# Patient Record
Sex: Female | Born: 1990 | Race: Black or African American | Hispanic: No | Marital: Single | Smoking: Never smoker
Health system: Southern US, Community
[De-identification: ages and names within clinical notes are randomized; demographics above are authoritative.]

## PROBLEM LIST (undated history)

## (undated) DIAGNOSIS — Z789 Other specified health status: Secondary | ICD-10-CM

## (undated) DIAGNOSIS — B009 Herpesviral infection, unspecified: Secondary | ICD-10-CM

## (undated) HISTORY — PX: NO PAST SURGERIES: SHX2092

## (undated) HISTORY — DX: Other specified health status: Z78.9

---

## 2014-03-04 DIAGNOSIS — B009 Herpesviral infection, unspecified: Secondary | ICD-10-CM

## 2014-03-04 HISTORY — DX: Herpesviral infection, unspecified: B00.9

## 2014-06-04 NOTE — L&D Delivery Note (Cosign Needed)
Delivery Note Pt's foley came out at approx 0330 and then she progressed rapidly to delivery, and at 3:43 AM a viable female was delivered via Vaginal, Spontaneous Delivery (Presentation: Right Occiput Transverse).  APGAR: 2, 7; weight 5 lb 11.9 oz (2605 g).   Placenta status: Intact, Spontaneous.  Cord: 3 vessels  Cord pH: collected, but was not sent for resulting  Anesthesia: None  Episiotomy: None Lacerations: None Est. Blood Loss (mL): 175  Mom to postpartum.  Baby to Couplet care / Skin to Skin.   [name], Johnmark Geiger CNM 03/19/2015, 4:03 AM

## 2014-08-04 LAB — OB RESULTS CONSOLE ANTIBODY SCREEN: ANTIBODY SCREEN: NEGATIVE

## 2014-08-04 LAB — OB RESULTS CONSOLE HGB/HCT, BLOOD
HCT: 33 %
Hemoglobin: 11.6 g/dL

## 2014-08-04 LAB — OB RESULTS CONSOLE RPR: RPR: NONREACTIVE

## 2014-08-04 LAB — OB RESULTS CONSOLE HEPATITIS B SURFACE ANTIGEN: Hepatitis B Surface Ag: NEGATIVE

## 2014-08-04 LAB — OB RESULTS CONSOLE HIV ANTIBODY (ROUTINE TESTING): HIV: NONREACTIVE

## 2014-08-04 LAB — OB RESULTS CONSOLE PLATELET COUNT: PLATELETS: 249 10*3/uL

## 2014-08-04 LAB — OB RESULTS CONSOLE ABO/RH: RH Type: POSITIVE

## 2014-08-04 LAB — OB RESULTS CONSOLE RUBELLA ANTIBODY, IGM: RUBELLA: IMMUNE

## 2014-09-16 LAB — OB RESULTS CONSOLE GC/CHLAMYDIA
Chlamydia: NEGATIVE
GC PROBE AMP, GENITAL: NEGATIVE

## 2014-12-25 LAB — GLUCOSE TOLERANCE, 1 HOUR (50G) W/O FASTING: Glucose 1 Hr Prenatal, POC: 93 mg/dL

## 2015-01-07 ENCOUNTER — Encounter: Payer: Self-pay | Admitting: *Deleted

## 2015-01-19 ENCOUNTER — Encounter: Payer: Self-pay | Admitting: Advanced Practice Midwife

## 2015-01-26 ENCOUNTER — Encounter: Payer: Self-pay | Admitting: Physician Assistant

## 2015-02-01 ENCOUNTER — Encounter: Payer: Self-pay | Admitting: *Deleted

## 2015-02-01 ENCOUNTER — Encounter: Payer: Self-pay | Admitting: Advanced Practice Midwife

## 2015-02-01 ENCOUNTER — Ambulatory Visit (INDEPENDENT_AMBULATORY_CARE_PROVIDER_SITE_OTHER): Payer: Medicaid Other | Admitting: Advanced Practice Midwife

## 2015-02-01 VITALS — BP 110/67 | HR 82 | Temp 98.2°F | Ht 65.5 in | Wt 129.0 lb

## 2015-02-01 DIAGNOSIS — Z3493 Encounter for supervision of normal pregnancy, unspecified, third trimester: Secondary | ICD-10-CM | POA: Diagnosis not present

## 2015-02-01 DIAGNOSIS — Z23 Encounter for immunization: Secondary | ICD-10-CM

## 2015-02-01 DIAGNOSIS — Z3483 Encounter for supervision of other normal pregnancy, third trimester: Secondary | ICD-10-CM

## 2015-02-01 DIAGNOSIS — Z3480 Encounter for supervision of other normal pregnancy, unspecified trimester: Secondary | ICD-10-CM | POA: Insufficient documentation

## 2015-02-01 DIAGNOSIS — B009 Herpesviral infection, unspecified: Secondary | ICD-10-CM | POA: Insufficient documentation

## 2015-02-01 LAB — POCT URINALYSIS DIP (DEVICE)
Bilirubin Urine: NEGATIVE
Glucose, UA: NEGATIVE mg/dL
Hgb urine dipstick: NEGATIVE
Ketones, ur: NEGATIVE mg/dL
Nitrite: NEGATIVE
PH: 7 (ref 5.0–8.0)
PROTEIN: NEGATIVE mg/dL
SPECIFIC GRAVITY, URINE: 1.015 (ref 1.005–1.030)
Urobilinogen, UA: 0.2 mg/dL (ref 0.0–1.0)

## 2015-02-01 LAB — CBC
HCT: 31 % — ABNORMAL LOW (ref 36.0–46.0)
Hemoglobin: 10.9 g/dL — ABNORMAL LOW (ref 12.0–15.0)
MCH: 34.5 pg — AB (ref 26.0–34.0)
MCHC: 35.2 g/dL (ref 30.0–36.0)
MCV: 98.1 fL (ref 78.0–100.0)
MPV: 9.2 fL (ref 8.6–12.4)
Platelets: 183 10*3/uL (ref 150–400)
RBC: 3.16 MIL/uL — ABNORMAL LOW (ref 3.87–5.11)
RDW: 13 % (ref 11.5–15.5)
WBC: 5.1 10*3/uL (ref 4.0–10.5)

## 2015-02-01 MED ORDER — TETANUS-DIPHTH-ACELL PERTUSSIS 5-2.5-18.5 LF-MCG/0.5 IM SUSP
0.5000 mL | Freq: Once | INTRAMUSCULAR | Status: AC
  Administered 2015-02-01: 0.5 mL via INTRAMUSCULAR

## 2015-02-01 NOTE — Patient Instructions (Signed)
Third Trimester of Pregnancy The third trimester is from week 29 through week 42, months 7 through 9. The third trimester is a time when the fetus is growing rapidly. At the end of the ninth month, the fetus is about 20 inches in length and weighs 6-10 pounds.  BODY CHANGES Your body goes through many changes during pregnancy. The changes vary from woman to woman.   Your weight will continue to increase. You can expect to gain 25-35 pounds (11-16 kg) by the end of the pregnancy.  You may begin to get stretch marks on your hips, abdomen, and breasts.  You may urinate more often because the fetus is moving lower into your pelvis and pressing on your bladder.  You may develop or continue to have heartburn as a result of your pregnancy.  You may develop constipation because certain hormones are causing the muscles that push waste through your intestines to slow down.  You may develop hemorrhoids or swollen, bulging veins (varicose veins).  You may have pelvic pain because of the weight gain and pregnancy hormones relaxing your joints between the bones in your pelvis. Backaches may result from overexertion of the muscles supporting your posture.  You may have changes in your hair. These can include thickening of your hair, rapid growth, and changes in texture. Some women also have hair loss during or after pregnancy, or hair that feels dry or thin. Your hair will most likely return to normal after your baby is born.  Your breasts will continue to grow and be tender. A yellow discharge may leak from your breasts called colostrum.  Your belly button may stick out.  You may feel short of breath because of your expanding uterus.  You may notice the fetus "dropping," or moving lower in your abdomen.  You may have a bloody mucus discharge. This usually occurs a few days to a week before labor begins.  Your cervix becomes thin and soft (effaced) near your due date. WHAT TO EXPECT AT YOUR PRENATAL  EXAMS  You will have prenatal exams every 2 weeks until week 36. Then, you will have weekly prenatal exams. During a routine prenatal visit:  You will be weighed to make sure you and the fetus are growing normally.  Your blood pressure is taken.  Your abdomen will be measured to track your baby's growth.  The fetal heartbeat will be listened to.  Any test results from the previous visit will be discussed.  You may have a cervical check near your due date to see if you have effaced. At around 36 weeks, your caregiver will check your cervix. At the same time, your caregiver will also perform a test on the secretions of the vaginal tissue. This test is to determine if a type of bacteria, Group B streptococcus, is present. Your caregiver will explain this further. Your caregiver may ask you:  What your birth plan is.  How you are feeling.  If you are feeling the baby move.  If you have had any abnormal symptoms, such as leaking fluid, bleeding, severe headaches, or abdominal cramping.  If you have any questions. Other tests or screenings that may be performed during your third trimester include:  Blood tests that check for low iron levels (anemia).  Fetal testing to check the health, activity level, and growth of the fetus. Testing is done if you have certain medical conditions or if there are problems during the pregnancy. FALSE LABOR You may feel small, irregular contractions that   eventually go away. These are called Braxton Hicks contractions, or false labor. Contractions may last for hours, days, or even weeks before true labor sets in. If contractions come at regular intervals, intensify, or become painful, it is best to be seen by your caregiver.  SIGNS OF LABOR   Menstrual-like cramps.  Contractions that are 5 minutes apart or less.  Contractions that start on the top of the uterus and spread down to the lower abdomen and back.  A sense of increased pelvic pressure or back  pain.  A watery or bloody mucus discharge that comes from the vagina. If you have any of these signs before the 37th week of pregnancy, call your caregiver right away. You need to go to the hospital to get checked immediately. HOME CARE INSTRUCTIONS   Avoid all smoking, herbs, alcohol, and unprescribed drugs. These chemicals affect the formation and growth of the baby.  Follow your caregiver's instructions regarding medicine use. There are medicines that are either safe or unsafe to take during pregnancy.  Exercise only as directed by your caregiver. Experiencing uterine cramps is a good sign to stop exercising.  Continue to eat regular, healthy meals.  Wear a good support bra for breast tenderness.  Do not use hot tubs, steam rooms, or saunas.  Wear your seat belt at all times when driving.  Avoid raw meat, uncooked cheese, cat litter boxes, and soil used by cats. These carry germs that can cause birth defects in the baby.  Take your prenatal vitamins.  Try taking a stool softener (if your caregiver approves) if you develop constipation. Eat more high-fiber foods, such as fresh vegetables or fruit and whole grains. Drink plenty of fluids to keep your urine clear or pale yellow.  Take warm sitz baths to soothe any pain or discomfort caused by hemorrhoids. Use hemorrhoid cream if your caregiver approves.  If you develop varicose veins, wear support hose. Elevate your feet for 15 minutes, 3-4 times a day. Limit salt in your diet.  Avoid heavy lifting, wear low heal shoes, and practice good posture.  Rest a lot with your legs elevated if you have leg cramps or low back pain.  Visit your dentist if you have not gone during your pregnancy. Use a soft toothbrush to brush your teeth and be gentle when you floss.  A sexual relationship may be continued unless your caregiver directs you otherwise.  Do not travel far distances unless it is absolutely necessary and only with the approval  of your caregiver.  Take prenatal classes to understand, practice, and ask questions about the labor and delivery.  Make a trial run to the hospital.  Pack your hospital bag.  Prepare the baby's nursery.  Continue to go to all your prenatal visits as directed by your caregiver. SEEK MEDICAL CARE IF:  You are unsure if you are in labor or if your water has broken.  You have dizziness.  You have mild pelvic cramps, pelvic pressure, or nagging pain in your abdominal area.  You have persistent nausea, vomiting, or diarrhea.  You have a bad smelling vaginal discharge.  You have pain with urination. SEEK IMMEDIATE MEDICAL CARE IF:   You have a fever.  You are leaking fluid from your vagina.  You have spotting or bleeding from your vagina.  You have severe abdominal cramping or pain.  You have rapid weight loss or gain.  You have shortness of breath with chest pain.  You notice sudden or extreme swelling   of your face, hands, ankles, feet, or legs.  You have not felt your baby move in over an hour.  You have severe headaches that do not go away with medicine.  You have vision changes. Document Released: 05/15/2001 Document Revised: 05/26/2013 Document Reviewed: 07/22/2012 ExitCare Patient Information 2015 [employer]. This information is not intended to replace advice given to you by your health care provider. Make sure you discuss any questions you have with your health care provider.  

## 2015-02-01 NOTE — Progress Notes (Signed)
Initial OB visit, transferred from [place]. TDap vaccine to be given, declined flu. Breastfeeding tip of the week reviewed.

## 2015-02-01 NOTE — Progress Notes (Signed)
   Subjective:    Kim Flowers is a G2P1001 [redacted]w[redacted]d being seen today for her first obstetrical visit.  She is transferring care from [facility].  Her obstetrical history is significant for NSVD x 1 at term. Patient does intend to breast feed. Pregnancy history fully reviewed.  Patient reports no complaints.  Filed Vitals:   02/01/15 0920 02/01/15 0925  BP: 110/67   Pulse: 82   Temp: 98.2 F (36.8 C)   Height:  5' 5.5" (1.664 m)  Weight: 129 lb (58.514 kg)     HISTORY: OB History  Gravida Para Term Preterm AB SAB TAB Ectopic Multiple Living  # Outcome Date GA Lbr Len/2nd Weight Sex Delivery Anes PTL Lv  2 Current           1 Term 10/22/11 [redacted]w[redacted]d  7 lb 7 oz (3.374 kg) F Vag-Spont   Y     History reviewed. No pertinent past medical history. History reviewed. No pertinent past surgical history. Family History  Problem Relation Age of Onset  . Cancer Mother   . Heart disease Father   . Diabetes Paternal Grandfather      Exam    Uterus:     Pelvic Exam: Deferred--done at previous practice, record reviewed  System: Breast:  normal appearance, no masses or tenderness   Skin: normal coloration and turgor, no rashes    Neurologic: gait normal; reflexes normal and symmetric   Extremities: normal strength, tone, and muscle mass, ROM of all joints is normal   HEENT sclera clear, anicteric   Mouth/Teeth mucous membranes moist, pharynx normal without lesions and dental hygiene good   Neck supple and no masses   Cardiovascular:    Respiratory:  appears well, vitals normal, no respiratory distress, acyanotic, normal RR, ear and throat exam is normal, neck free of mass or lymphadenopathy   Abdomen: soft, non-tender; bowel sounds normal; no masses,  no organomegaly   Urinary: not evaluated      Assessment:    Pregnancy: G2P1001 Patient Active Problem List   Diagnosis Date Noted  . Herpes 02/01/2015  . Supervision of normal subsequent pregnancy 02/01/2015         Plan:     28 week labs drawn. Prenatal vitamins. Discussed HSV prophylaxis at 36 weeks. Pt reports she has medication at home, will bring at next visit to confirm correct dose. Problem list reviewed and updated. Genetic Screening discussed : results reviewed.  Ultrasound discussed; fetal survey: results reviewed.  Follow up in 2 weeks.   LEFTWICH-KIRBY, LISA 02/01/2015

## 2015-02-02 LAB — RPR

## 2015-02-02 LAB — HIV ANTIBODY (ROUTINE TESTING W REFLEX): HIV: NONREACTIVE

## 2015-02-14 ENCOUNTER — Encounter: Payer: Self-pay | Admitting: *Deleted

## 2015-02-15 ENCOUNTER — Encounter: Payer: Self-pay | Admitting: Advanced Practice Midwife

## 2015-02-15 ENCOUNTER — Ambulatory Visit (INDEPENDENT_AMBULATORY_CARE_PROVIDER_SITE_OTHER): Payer: Medicaid Other | Admitting: Advanced Practice Midwife

## 2015-02-15 VITALS — BP 105/75 | HR 103 | Temp 98.1°F | Wt 131.5 lb

## 2015-02-15 DIAGNOSIS — Z3483 Encounter for supervision of other normal pregnancy, third trimester: Secondary | ICD-10-CM

## 2015-02-15 DIAGNOSIS — O26849 Uterine size-date discrepancy, unspecified trimester: Secondary | ICD-10-CM | POA: Insufficient documentation

## 2015-02-15 DIAGNOSIS — O3660X0 Maternal care for excessive fetal growth, unspecified trimester, not applicable or unspecified: Secondary | ICD-10-CM | POA: Diagnosis not present

## 2015-02-15 DIAGNOSIS — B009 Herpesviral infection, unspecified: Secondary | ICD-10-CM | POA: Diagnosis not present

## 2015-02-15 LAB — POCT URINALYSIS DIP (DEVICE)
Bilirubin Urine: NEGATIVE
GLUCOSE, UA: NEGATIVE mg/dL
HGB URINE DIPSTICK: NEGATIVE
Ketones, ur: NEGATIVE mg/dL
Leukocytes, UA: NEGATIVE
Nitrite: NEGATIVE
PROTEIN: NEGATIVE mg/dL
SPECIFIC GRAVITY, URINE: 1.015 (ref 1.005–1.030)
UROBILINOGEN UA: 0.2 mg/dL (ref 0.0–1.0)
pH: 7.5 (ref 5.0–8.0)

## 2015-02-15 NOTE — Progress Notes (Signed)
Subjective:  Kim Flowers is a 24 y.o. G2P1001 at [redacted]w[redacted]d being seen today for ongoing prenatal care.  Patient reports about 4 contractions per day. Good FM.  Contractions: Irritability.  Vag. Bleeding: None. Movement: Present. Denies leaking of fluid.   The following portions of the patient's history were reviewed and updated as appropriate: allergies, current medications, past family history, past medical history, past social history, past surgical history and problem list.   Objective:   Filed Vitals:   02/15/15 1107  BP: 105/75  Pulse: 103  Temp: 98.1 F (36.7 C)  Weight: 131 lb 8 oz (59.648 kg)    Fetal Status: Fetal Heart Rate (bpm): 132 Fundal Height: 32 cm Movement: Present     General:  Alert, oriented and cooperative. Patient is in no acute distress.  Skin: Skin is warm and dry. No rash noted.   Cardiovascular: Normal heart rate noted  Respiratory: Normal respiratory effort, no problems with respiration noted  Abdomen: Soft, gravid, appropriate for gestational age. Pain/Pressure: Present     Pelvic: Vag. Bleeding: None     Cervical exam deferred        Extremities: Normal range of motion.  Edema: None  Mental Status: Normal mood and affect. Normal behavior. Normal judgment and thought content.   Urinalysis: Urine Protein: Negative Urine Glucose: Negative  Assessment and Plan:  Pregnancy: G2P1001 at [redacted]w[redacted]d  1. Encounter for supervision of other normal pregnancy in third trimester    Discussed irregular contractions are not harmful.  Come in if more than 5/hr     Asked if she can go to fair. Discussed heat precautions and over exertion  2. Herpes   3. Fetal size inconsistent with dates     [place] ordered for measurement  - [place] MFM OB COMP + 14 WK; Future  Preterm labor symptoms and general obstetric precautions including but not limited to vaginal bleeding, contractions, leaking of fluid and fetal movement were reviewed in detail with the patient. Please refer to  After Visit Summary for other counseling recommendations.  RTO 2 weeks  Aviva Signs, CNM

## 2015-02-15 NOTE — Progress Notes (Signed)
Patient reports pelvic pressure and occasional painful contractions; patient reports leg cramps at night

## 2015-02-15 NOTE — Patient Instructions (Signed)
Third Trimester of Pregnancy The third trimester is from week 29 through week 42, months 7 through 9. The third trimester is a time when the fetus is growing rapidly. At the end of the ninth month, the fetus is about 20 inches in length and weighs 6-10 pounds.  BODY CHANGES Your body goes through many changes during pregnancy. The changes vary from woman to woman.   Your weight will continue to increase. You can expect to gain 25-35 pounds (11-16 kg) by the end of the pregnancy.  You may begin to get stretch marks on your hips, abdomen, and breasts.  You may urinate more often because the fetus is moving lower into your pelvis and pressing on your bladder.  You may develop or continue to have heartburn as a result of your pregnancy.  You may develop constipation because certain hormones are causing the muscles that push waste through your intestines to slow down.  You may develop hemorrhoids or swollen, bulging veins (varicose veins).  You may have pelvic pain because of the weight gain and pregnancy hormones relaxing your joints between the bones in your pelvis. Backaches may result from overexertion of the muscles supporting your posture.  You may have changes in your hair. These can include thickening of your hair, rapid growth, and changes in texture. Some women also have hair loss during or after pregnancy, or hair that feels dry or thin. Your hair will most likely return to normal after your baby is born.  Your breasts will continue to grow and be tender. A yellow discharge may leak from your breasts called colostrum.  Your belly button may stick out.  You may feel short of breath because of your expanding uterus.  You may notice the fetus "dropping," or moving lower in your abdomen.  You may have a bloody mucus discharge. This usually occurs a few days to a week before labor begins.  Your cervix becomes thin and soft (effaced) near your due date. WHAT TO EXPECT AT YOUR PRENATAL  EXAMS  You will have prenatal exams every 2 weeks until week 36. Then, you will have weekly prenatal exams. During a routine prenatal visit:  You will be weighed to make sure you and the fetus are growing normally.  Your blood pressure is taken.  Your abdomen will be measured to track your baby's growth.  The fetal heartbeat will be listened to.  Any test results from the previous visit will be discussed.  You may have a cervical check near your due date to see if you have effaced. At around 36 weeks, your caregiver will check your cervix. At the same time, your caregiver will also perform a test on the secretions of the vaginal tissue. This test is to determine if a type of bacteria, Group B streptococcus, is present. Your caregiver will explain this further. Your caregiver may ask you:  What your birth plan is.  How you are feeling.  If you are feeling the baby move.  If you have had any abnormal symptoms, such as leaking fluid, bleeding, severe headaches, or abdominal cramping.  If you have any questions. Other tests or screenings that may be performed during your third trimester include:  Blood tests that check for low iron levels (anemia).  Fetal testing to check the health, activity level, and growth of the fetus. Testing is done if you have certain medical conditions or if there are problems during the pregnancy. FALSE LABOR You may feel small, irregular contractions that   eventually go away. These are called Braxton Hicks contractions, or false labor. Contractions may last for hours, days, or even weeks before true labor sets in. If contractions come at regular intervals, intensify, or become painful, it is best to be seen by your caregiver.  SIGNS OF LABOR   Menstrual-like cramps.  Contractions that are 5 minutes apart or less.  Contractions that start on the top of the uterus and spread down to the lower abdomen and back.  A sense of increased pelvic pressure or back  pain.  A watery or bloody mucus discharge that comes from the vagina. If you have any of these signs before the 37th week of pregnancy, call your caregiver right away. You need to go to the hospital to get checked immediately. HOME CARE INSTRUCTIONS   Avoid all smoking, herbs, alcohol, and unprescribed drugs. These chemicals affect the formation and growth of the baby.  Follow your caregiver's instructions regarding medicine use. There are medicines that are either safe or unsafe to take during pregnancy.  Exercise only as directed by your caregiver. Experiencing uterine cramps is a good sign to stop exercising.  Continue to eat regular, healthy meals.  Wear a good support bra for breast tenderness.  Do not use hot tubs, steam rooms, or saunas.  Wear your seat belt at all times when driving.  Avoid raw meat, uncooked cheese, cat litter boxes, and soil used by cats. These carry germs that can cause birth defects in the baby.  Take your prenatal vitamins.  Try taking a stool softener (if your caregiver approves) if you develop constipation. Eat more high-fiber foods, such as fresh vegetables or fruit and whole grains. Drink plenty of fluids to keep your urine clear or pale yellow.  Take warm sitz baths to soothe any pain or discomfort caused by hemorrhoids. Use hemorrhoid cream if your caregiver approves.  If you develop varicose veins, wear support hose. Elevate your feet for 15 minutes, 3-4 times a day. Limit salt in your diet.  Avoid heavy lifting, wear low heal shoes, and practice good posture.  Rest a lot with your legs elevated if you have leg cramps or low back pain.  Visit your dentist if you have not gone during your pregnancy. Use a soft toothbrush to brush your teeth and be gentle when you floss.  A sexual relationship may be continued unless your caregiver directs you otherwise.  Do not travel far distances unless it is absolutely necessary and only with the approval  of your caregiver.  Take prenatal classes to understand, practice, and ask questions about the labor and delivery.  Make a trial run to the hospital.  Pack your hospital bag.  Prepare the baby's nursery.  Continue to go to all your prenatal visits as directed by your caregiver. SEEK MEDICAL CARE IF:  You are unsure if you are in labor or if your water has broken.  You have dizziness.  You have mild pelvic cramps, pelvic pressure, or nagging pain in your abdominal area.  You have persistent nausea, vomiting, or diarrhea.  You have a bad smelling vaginal discharge.  You have pain with urination. SEEK IMMEDIATE MEDICAL CARE IF:   You have a fever.  You are leaking fluid from your vagina.  You have spotting or bleeding from your vagina.  You have severe abdominal cramping or pain.  You have rapid weight loss or gain.  You have shortness of breath with chest pain.  You notice sudden or extreme swelling   of your face, hands, ankles, feet, or legs.  You have not felt your baby move in over an hour.  You have severe headaches that do not go away with medicine.  You have vision changes. Document Released: 05/15/2001 Document Revised: 05/26/2013 Document Reviewed: 07/22/2012 ExitCare Patient Information 2015 [employer]. This information is not intended to replace advice given to you by your health care provider. Make sure you discuss any questions you have with your health care provider.  

## 2015-02-24 ENCOUNTER — Ambulatory Visit (HOSPITAL_COMMUNITY)
Admission: RE | Admit: 2015-02-24 | Discharge: 2015-02-24 | Disposition: A | Discharge status: Home / Self Care | Payer: Medicaid Other | Source: Ambulatory Visit | Attending: Advanced Practice Midwife | Admitting: Advanced Practice Midwife

## 2015-02-24 ENCOUNTER — Other Ambulatory Visit: Payer: Self-pay | Admitting: Advanced Practice Midwife

## 2015-02-24 DIAGNOSIS — O365910 Maternal care for other known or suspected poor fetal growth, first trimester, not applicable or unspecified: Secondary | ICD-10-CM

## 2015-02-24 DIAGNOSIS — Z3A35 35 weeks gestation of pregnancy: Secondary | ICD-10-CM | POA: Diagnosis not present

## 2015-02-24 DIAGNOSIS — O26849 Uterine size-date discrepancy, unspecified trimester: Secondary | ICD-10-CM

## 2015-02-24 DIAGNOSIS — O365930 Maternal care for other known or suspected poor fetal growth, third trimester, not applicable or unspecified: Secondary | ICD-10-CM | POA: Insufficient documentation

## 2015-03-01 ENCOUNTER — Other Ambulatory Visit (HOSPITAL_COMMUNITY)
Admission: RE | Admit: 2015-03-01 | Discharge: 2015-03-01 | Disposition: A | Discharge status: Home / Self Care | Payer: Medicaid Other | Source: Ambulatory Visit | Attending: Family Medicine | Admitting: Family Medicine

## 2015-03-01 ENCOUNTER — Ambulatory Visit (INDEPENDENT_AMBULATORY_CARE_PROVIDER_SITE_OTHER): Payer: Medicaid Other | Admitting: Family Medicine

## 2015-03-01 VITALS — BP 117/73 | HR 87 | Temp 98.6°F | Wt 135.1 lb

## 2015-03-01 DIAGNOSIS — B009 Herpesviral infection, unspecified: Secondary | ICD-10-CM

## 2015-03-01 DIAGNOSIS — Z118 Encounter for screening for other infectious and parasitic diseases: Secondary | ICD-10-CM

## 2015-03-01 DIAGNOSIS — Z3483 Encounter for supervision of other normal pregnancy, third trimester: Secondary | ICD-10-CM | POA: Diagnosis present

## 2015-03-01 DIAGNOSIS — Z113 Encounter for screening for infections with a predominantly sexual mode of transmission: Secondary | ICD-10-CM

## 2015-03-01 DIAGNOSIS — O26849 Uterine size-date discrepancy, unspecified trimester: Secondary | ICD-10-CM

## 2015-03-01 DIAGNOSIS — O3660X0 Maternal care for excessive fetal growth, unspecified trimester, not applicable or unspecified: Secondary | ICD-10-CM

## 2015-03-01 LAB — POCT URINALYSIS DIP (DEVICE)
BILIRUBIN URINE: NEGATIVE
GLUCOSE, UA: NEGATIVE mg/dL
HGB URINE DIPSTICK: NEGATIVE
Ketones, ur: NEGATIVE mg/dL
NITRITE: NEGATIVE
Protein, ur: NEGATIVE mg/dL
SPECIFIC GRAVITY, URINE: 1.02 (ref 1.005–1.030)
Urobilinogen, UA: 0.2 mg/dL (ref 0.0–1.0)
pH: 7.5 (ref 5.0–8.0)

## 2015-03-01 LAB — OB RESULTS CONSOLE GC/CHLAMYDIA: GC PROBE AMP, GENITAL: NEGATIVE

## 2015-03-01 LAB — OB RESULTS CONSOLE GBS: GBS: NEGATIVE

## 2015-03-01 MED ORDER — ACYCLOVIR 400 MG PO TABS: 400.0000 mg | ORAL_TABLET | Freq: Two times a day (BID) | ORAL | Status: AC

## 2015-03-01 NOTE — Progress Notes (Signed)
Patient has questions about recent ultrasound and growth of baby; patient states she has been craving soap lately but has not ate any

## 2015-03-01 NOTE — Progress Notes (Signed)
Subjective:  Kim Flowers is a 24 y.o. G2P1001 at [redacted]w[redacted]d being seen today for ongoing prenatal care.  Patient reports craving soap, questions about [place].  Contractions: Irritability.  Vag. Bleeding: None. Movement: Present. Denies leaking of fluid.   The following portions of the patient's history were reviewed and updated as appropriate: allergies, current medications, past family history, past medical history, past social history, past surgical history and problem list.   Objective:   Filed Vitals:   03/01/15 1628  BP: 117/73  Pulse: 87  Temp: 98.6 F (37 C)  Weight: 135 lb 1.6 oz (61.281 kg)    Fetal Status: Fetal Heart Rate (bpm): 135   Movement: Present     General:  Alert, oriented and cooperative. Patient is in no acute distress.  Skin: Skin is warm and dry. No rash noted.   Cardiovascular: Normal heart rate noted  Respiratory: Normal respiratory effort, no problems with respiration noted  Abdomen: Soft, gravid, appropriate for gestational age. Pain/Pressure: Present     Pelvic: Vag. Bleeding: None Vag D/C Character: White   Cervical exam deferred        Extremities: Normal range of motion.  Edema: None  Mental Status: Normal mood and affect. Normal behavior. Normal judgment and thought content.   Urinalysis:      Assessment and Plan:  Pregnancy: G2P1001 at [redacted]w[redacted]d  1. Encounter for supervision of other normal pregnancy in third trimester - updated pregnancy box - Collect GBS and CT/GC - Recommended increasing Fe rich foods  2. Herpes -Start suppression therapy- Acyclovir 400 BID suppression  3. Fetal size inconsistent with dates - Needs weekly BPP and dopplers and IOL at 39 weeks per MFM  Preterm labor symptoms and general obstetric precautions including but not limited to vaginal bleeding, contractions, leaking of fluid and fetal movement were reviewed in detail with the patient. Please refer to After Visit Summary for other counseling recommendations.   Return  in about 1 week (around 03/08/2015) for Routine prenatal care.   Federico Flake, MD

## 2015-03-01 NOTE — Patient Instructions (Signed)
Third Trimester of Pregnancy The third trimester is from week 29 through week 42, months 7 through 9. The third trimester is a time when the fetus is growing rapidly. At the end of the ninth month, the fetus is about 20 inches in length and weighs 6-10 pounds.  BODY CHANGES Your body goes through many changes during pregnancy. The changes vary from woman to woman.   Your weight will continue to increase. You can expect to gain 25-35 pounds (11-16 kg) by the end of the pregnancy.  You may begin to get stretch marks on your hips, abdomen, and breasts.  You may urinate more often because the fetus is moving lower into your pelvis and pressing on your bladder.  You may develop or continue to have heartburn as a result of your pregnancy.  You may develop constipation because certain hormones are causing the muscles that push waste through your intestines to slow down.  You may develop hemorrhoids or swollen, bulging veins (varicose veins).  You may have pelvic pain because of the weight gain and pregnancy hormones relaxing your joints between the bones in your pelvis. Backaches may result from overexertion of the muscles supporting your posture.  You may have changes in your hair. These can include thickening of your hair, rapid growth, and changes in texture. Some women also have hair loss during or after pregnancy, or hair that feels dry or thin. Your hair will most likely return to normal after your baby is born.  Your breasts will continue to grow and be tender. A yellow discharge may leak from your breasts called colostrum.  Your belly button may stick out.  You may feel short of breath because of your expanding uterus.  You may notice the fetus "dropping," or moving lower in your abdomen.  You may have a bloody mucus discharge. This usually occurs a few days to a week before labor begins.  Your cervix becomes thin and soft (effaced) near your due date. WHAT TO EXPECT AT YOUR PRENATAL  EXAMS  You will have prenatal exams every 2 weeks until week 36. Then, you will have weekly prenatal exams. During a routine prenatal visit:  You will be weighed to make sure you and the fetus are growing normally.  Your blood pressure is taken.  Your abdomen will be measured to track your baby's growth.  The fetal heartbeat will be listened to.  Any test results from the previous visit will be discussed.  You may have a cervical check near your due date to see if you have effaced. At around 36 weeks, your caregiver will check your cervix. At the same time, your caregiver will also perform a test on the secretions of the vaginal tissue. This test is to determine if a type of bacteria, Group B streptococcus, is present. Your caregiver will explain this further. Your caregiver may ask you:  What your birth plan is.  How you are feeling.  If you are feeling the baby move.  If you have had any abnormal symptoms, such as leaking fluid, bleeding, severe headaches, or abdominal cramping.  If you have any questions. Other tests or screenings that may be performed during your third trimester include:  Blood tests that check for low iron levels (anemia).  Fetal testing to check the health, activity level, and growth of the fetus. Testing is done if you have certain medical conditions or if there are problems during the pregnancy. FALSE LABOR You may feel small, irregular contractions that   eventually go away. These are called Braxton Hicks contractions, or false labor. Contractions may last for hours, days, or even weeks before true labor sets in. If contractions come at regular intervals, intensify, or become painful, it is best to be seen by your caregiver.  SIGNS OF LABOR   Menstrual-like cramps.  Contractions that are 5 minutes apart or less.  Contractions that start on the top of the uterus and spread down to the lower abdomen and back.  A sense of increased pelvic pressure or back  pain.  A watery or bloody mucus discharge that comes from the vagina. If you have any of these signs before the 37th week of pregnancy, call your caregiver right away. You need to go to the hospital to get checked immediately. HOME CARE INSTRUCTIONS   Avoid all smoking, herbs, alcohol, and unprescribed drugs. These chemicals affect the formation and growth of the baby.  Follow your caregiver's instructions regarding medicine use. There are medicines that are either safe or unsafe to take during pregnancy.  Exercise only as directed by your caregiver. Experiencing uterine cramps is a good sign to stop exercising.  Continue to eat regular, healthy meals.  Wear a good support bra for breast tenderness.  Do not use hot tubs, steam rooms, or saunas.  Wear your seat belt at all times when driving.  Avoid raw meat, uncooked cheese, cat litter boxes, and soil used by cats. These carry germs that can cause birth defects in the baby.  Take your prenatal vitamins.  Try taking a stool softener (if your caregiver approves) if you develop constipation. Eat more high-fiber foods, such as fresh vegetables or fruit and whole grains. Drink plenty of fluids to keep your urine clear or pale yellow.  Take warm sitz baths to soothe any pain or discomfort caused by hemorrhoids. Use hemorrhoid cream if your caregiver approves.  If you develop varicose veins, wear support hose. Elevate your feet for 15 minutes, 3-4 times a day. Limit salt in your diet.  Avoid heavy lifting, wear low heal shoes, and practice good posture.  Rest a lot with your legs elevated if you have leg cramps or low back pain.  Visit your dentist if you have not gone during your pregnancy. Use a soft toothbrush to brush your teeth and be gentle when you floss.  A sexual relationship may be continued unless your caregiver directs you otherwise.  Do not travel far distances unless it is absolutely necessary and only with the approval  of your caregiver.  Take prenatal classes to understand, practice, and ask questions about the labor and delivery.  Make a trial run to the hospital.  Pack your hospital bag.  Prepare the baby's nursery.  Continue to go to all your prenatal visits as directed by your caregiver. SEEK MEDICAL CARE IF:  You are unsure if you are in labor or if your water has broken.  You have dizziness.  You have mild pelvic cramps, pelvic pressure, or nagging pain in your abdominal area.  You have persistent nausea, vomiting, or diarrhea.  You have a bad smelling vaginal discharge.  You have pain with urination. SEEK IMMEDIATE MEDICAL CARE IF:   You have a fever.  You are leaking fluid from your vagina.  You have spotting or bleeding from your vagina.  You have severe abdominal cramping or pain.  You have rapid weight loss or gain.  You have shortness of breath with chest pain.  You notice sudden or extreme swelling   of your face, hands, ankles, feet, or legs.  You have not felt your baby move in over an hour.  You have severe headaches that do not go away with medicine.  You have vision changes. Document Released: 05/15/2001 Document Revised: 05/26/2013 Document Reviewed: 07/22/2012 ExitCare Patient Information 2015 [employer]. This information is not intended to replace advice given to you by your health care provider. Make sure you discuss any questions you have with your health care provider.  

## 2015-03-03 ENCOUNTER — Ambulatory Visit (HOSPITAL_COMMUNITY)
Admission: RE | Admit: 2015-03-03 | Discharge: 2015-03-03 | Disposition: A | Discharge status: Home / Self Care | Payer: Medicaid Other | Source: Ambulatory Visit | Attending: Advanced Practice Midwife | Admitting: Advanced Practice Midwife

## 2015-03-03 ENCOUNTER — Encounter (HOSPITAL_COMMUNITY): Payer: Self-pay

## 2015-03-03 ENCOUNTER — Other Ambulatory Visit (HOSPITAL_COMMUNITY): Payer: Self-pay | Admitting: Maternal and Fetal Medicine

## 2015-03-03 DIAGNOSIS — O365910 Maternal care for other known or suspected poor fetal growth, first trimester, not applicable or unspecified: Secondary | ICD-10-CM

## 2015-03-03 DIAGNOSIS — O365930 Maternal care for other known or suspected poor fetal growth, third trimester, not applicable or unspecified: Secondary | ICD-10-CM | POA: Diagnosis present

## 2015-03-03 DIAGNOSIS — Z3A36 36 weeks gestation of pregnancy: Secondary | ICD-10-CM | POA: Diagnosis not present

## 2015-03-03 LAB — CULTURE, BETA STREP (GROUP B ONLY)

## 2015-03-03 LAB — GC/CHLAMYDIA PROBE AMP (~~LOC~~) NOT AT ARMC
CHLAMYDIA, DNA PROBE: NEGATIVE
Neisseria Gonorrhea: NEGATIVE

## 2015-03-08 ENCOUNTER — Ambulatory Visit (INDEPENDENT_AMBULATORY_CARE_PROVIDER_SITE_OTHER): Payer: Medicaid Other | Admitting: Advanced Practice Midwife

## 2015-03-08 VITALS — BP 110/72 | HR 95 | Temp 98.5°F | Wt 139.3 lb

## 2015-03-08 DIAGNOSIS — Z3483 Encounter for supervision of other normal pregnancy, third trimester: Secondary | ICD-10-CM

## 2015-03-08 LAB — POCT URINALYSIS DIP (DEVICE)
BILIRUBIN URINE: NEGATIVE
Glucose, UA: NEGATIVE mg/dL
HGB URINE DIPSTICK: NEGATIVE
KETONES UR: NEGATIVE mg/dL
Leukocytes, UA: NEGATIVE
Nitrite: NEGATIVE
PH: 7 (ref 5.0–8.0)
Protein, ur: NEGATIVE mg/dL
SPECIFIC GRAVITY, URINE: 1.02 (ref 1.005–1.030)
Urobilinogen, UA: 0.2 mg/dL (ref 0.0–1.0)

## 2015-03-08 NOTE — Progress Notes (Signed)
Subjective:  Kim Flowers is a 24 y.o. G2P1001 at [redacted]w[redacted]d being seen today for ongoing prenatal care.  Patient reports occasional cramping/contractions.  Contractions: Irregular.  Vag. Bleeding: None. Movement: Present. Denies leaking of fluid.   The following portions of the patient's history were reviewed and updated as appropriate: allergies, current medications, past family history, past medical history, past social history, past surgical history and problem list.   Objective:   Filed Vitals:   03/08/15 1633  BP: 110/72  Pulse: 95  Temp: 98.5 F (36.9 C)  Weight: 139 lb 4.8 oz (63.186 kg)    Fetal Status: Fetal Heart Rate (bpm): 140 Fundal Height: 36 cm Movement: Present  Presentation: Vertex  General:  Alert, oriented and cooperative. Patient is in no acute distress.  Skin: Skin is warm and dry. No rash noted.   Cardiovascular: Normal heart rate noted  Respiratory: Normal respiratory effort, no problems with respiration noted  Abdomen: Soft, gravid, appropriate for gestational age. Pain/Pressure: Present     Pelvic: Vag. Bleeding: None     Cervical exam performed Dilation: 1 Effacement (%): 50 Station: -3  Extremities: Normal range of motion.  Edema: None  Mental Status: Normal mood and affect. Normal behavior. Normal judgment and thought content.   Urinalysis: Urine Protein: Negative Urine Glucose: Negative  Assessment and Plan:  Pregnancy: G2P1001 at [redacted]w[redacted]d  1. Encounter for supervision of other normal pregnancy in third trimester     Term labor symptoms and general obstetric precautions including but not limited to vaginal bleeding, contractions, leaking of fluid and fetal movement were reviewed in detail with the patient. Please refer to After Visit Summary for other counseling recommendations.  IOL scheduled for 39 weeks.  Pt has scheduled [place] next week to continue to evaluate IUGR and need for IOL earlier.  Return in about 1 week (around 03/15/2015).   Hurshel Party, CNM

## 2015-03-08 NOTE — Progress Notes (Signed)
IOL scheduled 10/13 @ 11:45pm

## 2015-03-08 NOTE — Progress Notes (Signed)
Breastfeeding tip of the week reviewed. 

## 2015-03-08 NOTE — Progress Notes (Signed)
Duplicate/error

## 2015-03-10 ENCOUNTER — Ambulatory Visit (HOSPITAL_COMMUNITY)
Admission: RE | Admit: 2015-03-10 | Discharge: 2015-03-10 | Disposition: A | Discharge status: Home / Self Care | Payer: Medicaid Other | Source: Ambulatory Visit | Attending: Advanced Practice Midwife | Admitting: Advanced Practice Midwife

## 2015-03-10 ENCOUNTER — Other Ambulatory Visit (HOSPITAL_COMMUNITY): Payer: Self-pay | Admitting: Maternal and Fetal Medicine

## 2015-03-10 DIAGNOSIS — O365910 Maternal care for other known or suspected poor fetal growth, first trimester, not applicable or unspecified: Secondary | ICD-10-CM

## 2015-03-10 DIAGNOSIS — O365930 Maternal care for other known or suspected poor fetal growth, third trimester, not applicable or unspecified: Secondary | ICD-10-CM

## 2015-03-10 DIAGNOSIS — O26843 Uterine size-date discrepancy, third trimester: Secondary | ICD-10-CM | POA: Insufficient documentation

## 2015-03-10 DIAGNOSIS — O26849 Uterine size-date discrepancy, unspecified trimester: Secondary | ICD-10-CM

## 2015-03-10 DIAGNOSIS — Z3A37 37 weeks gestation of pregnancy: Secondary | ICD-10-CM | POA: Diagnosis not present

## 2015-03-15 ENCOUNTER — Ambulatory Visit (INDEPENDENT_AMBULATORY_CARE_PROVIDER_SITE_OTHER): Payer: Medicaid Other | Admitting: Obstetrics and Gynecology

## 2015-03-15 VITALS — BP 125/85 | HR 97 | Temp 98.6°F | Wt 139.5 lb

## 2015-03-15 DIAGNOSIS — Z3483 Encounter for supervision of other normal pregnancy, third trimester: Secondary | ICD-10-CM

## 2015-03-15 LAB — POCT URINALYSIS DIP (DEVICE)
BILIRUBIN URINE: NEGATIVE
Glucose, UA: NEGATIVE mg/dL
HGB URINE DIPSTICK: NEGATIVE
KETONES UR: NEGATIVE mg/dL
Nitrite: NEGATIVE
PH: 7.5 (ref 5.0–8.0)
Protein, ur: NEGATIVE mg/dL
SPECIFIC GRAVITY, URINE: 1.015 (ref 1.005–1.030)
Urobilinogen, UA: 0.2 mg/dL (ref 0.0–1.0)

## 2015-03-15 NOTE — Progress Notes (Signed)
Subjective:  Kim Flowers is a 24 y.o. G2P1001 at [redacted]w[redacted]d being seen today for ongoing prenatal care.  Patient reports no complaints.  Contractions: Irregular.  Vag. Bleeding: None. Movement: Present. Denies leaking of fluid.   The following portions of the patient's history were reviewed and updated as appropriate: allergies, current medications, past family history, past medical history, past social history, past surgical history and problem list. Problem list updated.  Objective:   Filed Vitals:   03/15/15 1400  BP: 125/85  Pulse: 97  Temp: 98.6 F (37 C)  Weight: 139 lb 8 oz (63.277 kg)    Fetal Status: Fetal Heart Rate (bpm): 142   Movement: Present     General:  Alert, oriented and cooperative. Patient is in no acute distress.  Skin: Skin is warm and dry. No rash noted.   Cardiovascular: Normal heart rate noted  Respiratory: Normal respiratory effort, no problems with respiration noted  Abdomen: Soft, gravid, appropriate for gestational age. Pain/Pressure: Present     Pelvic: Vag. Bleeding: None     Cervical exam deferred        Extremities: Normal range of motion.  Edema: None  Mental Status: Normal mood and affect. Normal behavior. Normal judgment and thought content.   Urinalysis: Urine Protein: Negative Urine Glucose: Negative  Assessment and Plan:  Pregnancy: G2P1001 at [redacted]w[redacted]d  1. IUGR - u/s 10/13, iol scheduled for 10/14 - declines flu  2. hsv - on suppressive therapy, denies rash  Term labor symptoms and general obstetric precautions including but not limited to vaginal bleeding, contractions, leaking of fluid and fetal movement were reviewed in detail with the patient. Please refer to After Visit Summary for other counseling recommendations.  No Follow-up on file.   Kathrynn Running, MD

## 2015-03-16 ENCOUNTER — Telehealth (HOSPITAL_COMMUNITY): Payer: Self-pay | Admitting: *Deleted

## 2015-03-16 ENCOUNTER — Encounter (HOSPITAL_COMMUNITY): Payer: Self-pay | Admitting: *Deleted

## 2015-03-16 NOTE — Telephone Encounter (Signed)
Preadmission screen  

## 2015-03-17 ENCOUNTER — Other Ambulatory Visit (HOSPITAL_COMMUNITY): Payer: Self-pay | Admitting: Obstetrics and Gynecology

## 2015-03-17 ENCOUNTER — Ambulatory Visit (HOSPITAL_COMMUNITY)
Admission: RE | Admit: 2015-03-17 | Discharge: 2015-03-17 | Disposition: A | Discharge status: Home / Self Care | Payer: Medicaid Other | Source: Ambulatory Visit | Attending: Advanced Practice Midwife | Admitting: Advanced Practice Midwife

## 2015-03-17 DIAGNOSIS — O26849 Uterine size-date discrepancy, unspecified trimester: Secondary | ICD-10-CM

## 2015-03-18 ENCOUNTER — Inpatient Hospital Stay (HOSPITAL_COMMUNITY)
Admission: RE | Admit: 2015-03-18 | Discharge: 2015-03-20 | DRG: 775 | Disposition: A | Discharge status: Home / Self Care | Payer: Medicaid Other | Source: Ambulatory Visit | Attending: Obstetrics and Gynecology | Admitting: Obstetrics and Gynecology

## 2015-03-18 ENCOUNTER — Encounter (HOSPITAL_COMMUNITY): Payer: Self-pay

## 2015-03-18 VITALS — BP 107/66 | HR 88 | Temp 98.9°F | Resp 18 | Ht 65.5 in | Wt 141.0 lb

## 2015-03-18 DIAGNOSIS — Z3A39 39 weeks gestation of pregnancy: Secondary | ICD-10-CM

## 2015-03-18 DIAGNOSIS — Z3483 Encounter for supervision of other normal pregnancy, third trimester: Secondary | ICD-10-CM

## 2015-03-18 DIAGNOSIS — O365930 Maternal care for other known or suspected poor fetal growth, third trimester, not applicable or unspecified: Principal | ICD-10-CM | POA: Diagnosis present

## 2015-03-18 DIAGNOSIS — IMO0002 Reserved for concepts with insufficient information to code with codable children: Secondary | ICD-10-CM | POA: Diagnosis present

## 2015-03-18 DIAGNOSIS — O26849 Uterine size-date discrepancy, unspecified trimester: Secondary | ICD-10-CM

## 2015-03-18 HISTORY — DX: Herpesviral infection, unspecified: B00.9

## 2015-03-18 LAB — CBC
HCT: 35.3 % — ABNORMAL LOW (ref 36.0–46.0)
Hemoglobin: 11.9 g/dL — ABNORMAL LOW (ref 12.0–15.0)
MCH: 34 pg (ref 26.0–34.0)
MCHC: 33.7 g/dL (ref 30.0–36.0)
MCV: 100.9 fL — AB (ref 78.0–100.0)
PLATELETS: 206 10*3/uL (ref 150–400)
RBC: 3.5 MIL/uL — AB (ref 3.87–5.11)
RDW: 13.8 % (ref 11.5–15.5)
WBC: 5.5 10*3/uL (ref 4.0–10.5)

## 2015-03-18 LAB — ABO/RH: ABO/RH(D): AB POS

## 2015-03-18 LAB — TYPE AND SCREEN
ABO/RH(D): AB POS
ANTIBODY SCREEN: NEGATIVE

## 2015-03-18 MED ORDER — LACTATED RINGERS IV SOLN
INTRAVENOUS | Status: DC
  Administered 2015-03-18 – 2015-03-19 (×3): via INTRAVENOUS

## 2015-03-18 MED ORDER — ACYCLOVIR 400 MG PO TABS
400.0000 mg | ORAL_TABLET | Freq: Two times a day (BID) | ORAL | Status: DC
  Administered 2015-03-18: 400 mg via ORAL
  Filled 2015-03-18 (×3): qty 1

## 2015-03-18 MED ORDER — DIPHENHYDRAMINE HCL 25 MG PO CAPS
50.0000 mg | ORAL_CAPSULE | Freq: Once | ORAL | Status: AC
  Administered 2015-03-18: 50 mg via ORAL
  Filled 2015-03-18: qty 2

## 2015-03-18 MED ORDER — FLEET ENEMA 7-19 GM/118ML RE ENEM: 1.0000 | ENEMA | RECTAL | Status: DC | PRN

## 2015-03-18 MED ORDER — OXYTOCIN 40 UNITS IN LACTATED RINGERS INFUSION - SIMPLE MED
62.5000 mL/h | INTRAVENOUS | Status: DC
  Filled 2015-03-18: qty 1000

## 2015-03-18 MED ORDER — TERBUTALINE SULFATE 1 MG/ML IJ SOLN: 0.2500 mg | Freq: Once | INTRAMUSCULAR | Status: DC | PRN

## 2015-03-18 MED ORDER — PRENATAL MULTIVITAMIN CH: 1.0000 | ORAL_TABLET | Freq: Every day | ORAL | Status: DC

## 2015-03-18 MED ORDER — ACETAMINOPHEN 325 MG PO TABS: 650.0000 mg | ORAL_TABLET | ORAL | Status: DC | PRN

## 2015-03-18 MED ORDER — LACTATED RINGERS IV SOLN
500.0000 mL | INTRAVENOUS | Status: DC | PRN
  Administered 2015-03-18: 500 mL via INTRAVENOUS

## 2015-03-18 MED ORDER — LIDOCAINE HCL (PF) 1 % IJ SOLN
30.0000 mL | INTRAMUSCULAR | Status: DC | PRN
  Filled 2015-03-18: qty 30

## 2015-03-18 MED ORDER — MISOPROSTOL 25 MCG QUARTER TABLET
25.0000 ug | ORAL_TABLET | ORAL | Status: DC | PRN
  Administered 2015-03-18 (×2): 25 ug via VAGINAL
  Filled 2015-03-18 (×3): qty 0.25

## 2015-03-18 MED ORDER — OXYCODONE-ACETAMINOPHEN 5-325 MG PO TABS: 1.0000 | ORAL_TABLET | ORAL | Status: DC | PRN

## 2015-03-18 MED ORDER — FENTANYL CITRATE (PF) 100 MCG/2ML IJ SOLN
100.0000 ug | Freq: Once | INTRAMUSCULAR | Status: AC
  Administered 2015-03-18: 100 ug via INTRAVENOUS
  Filled 2015-03-18: qty 2

## 2015-03-18 MED ORDER — OXYTOCIN BOLUS FROM INFUSION
500.0000 mL | INTRAVENOUS | Status: DC
  Administered 2015-03-19: 500 mL via INTRAVENOUS

## 2015-03-18 MED ORDER — ONDANSETRON HCL 4 MG/2ML IJ SOLN: 4.0000 mg | Freq: Four times a day (QID) | INTRAMUSCULAR | Status: DC | PRN

## 2015-03-18 MED ORDER — CITRIC ACID-SODIUM CITRATE 334-500 MG/5ML PO SOLN: 30.0000 mL | ORAL | Status: DC | PRN

## 2015-03-18 MED ORDER — OXYCODONE-ACETAMINOPHEN 5-325 MG PO TABS: 2.0000 | ORAL_TABLET | ORAL | Status: DC | PRN

## 2015-03-18 NOTE — Progress Notes (Signed)
Kim StablerChristina Flowers is a 24 y.o. G2P1001 at 7754w0d admitted for induction of labor due to Poor fetal growth.  Subjective:   Objective: BP 111/66 mmHg  Pulse 103  Temp(Src) 98.5 F (36.9 C) (Oral)  Resp 18  Ht 5' 5.5" (1.664 m)  Wt 63.957 kg (141 lb)  BMI 23.10 kg/m2  LMP 06/18/2014 (Exact Date)    FHT:  FHR: 145 bpm, variability: moderate,  accelerations:  Present,  decelerations:  Absent UC:   irregular, every 2-3 minutes  SVE:   Dilation: 2 Effacement (%): 50 Station: -3 Exam by:: dr Alvester Morinnewton  Pitocin: planned when cervix is more favorable.   Labs: Lab Results  Component Value Date   WBC 5.5 03/18/2015   HGB 11.9* 03/18/2015   HCT 35.3* 03/18/2015   MCV 100.9* 03/18/2015   PLT 206 03/18/2015    Assessment / Plan: IOL 2/2 IUAGR. progressing well w/ cytotec for cervical ripening  Dose of Cytotec and cervical check performed by Dr. Alvester MorinNewton. Patient tolerated well. No new concerns at this time.  Labor: Progressing normally Fetal Wellbeing:  Category I Pain Control:  Epidural is desired, not yet placed Pre-eclampsia: no I/D:  GBS neg Anticipated MOD:  NSVD  Kathee DeltonIan D McKeag, MD,MS,  PGY2 03/18/2015 4:35 PM

## 2015-03-18 NOTE — H&P (Signed)
HPI: Kim StablerChristina Flowers is a 24 y.o. G2P1001 at 3715w0d who presents for IOL 2/2 IUGR, S/D 49%. Denies any recent issues or concerns. Some occasional contractions. Denies leakage of fluid or vaginal bleeding. Good fetal movement. FOB at Bedside.  Pregnancy Course:   [facility]  Dating LMP confirmed with second trim US Blood type: AB/Positive/-- (03/02 0000)   Genetic Screen Quad/AFP: wnl    NIPS: Antibody:Negative (03/02 0000)  Anatomic US wnl Rubella: Immune (03/02 0000)  GTT Early:               Third trimester: 93 RPR: Nonreactive (03/02 0000)   Flu vaccine declined HBsAg: Negative (03/02 0000)   TDaP vaccine  02/01/15                                   Rhogam: HIV: Non-reactive (03/02 0000)   Baby Food   breast                                        GBS:  negative  Contraception Discussed, Nexplanon vs Mirena Pap: Needs PP pap smear  Circumcision  Yes, they want to do it in hospital, will pay today   Pediatrician    Support Person  FOB     Past Medical History: Past Medical History  Diagnosis Date  . Medical history non-contributory   . 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 No past surgeries       Family History: Family History  Problem Relation Age of Onset  . Cancer Mother   . Heart disease Father   . Diabetes Paternal Grandfather     Social History: Social History  Substance Use Topics  . Smoking status: Never Smoker   . Smokeless tobacco: Never Used  . Alcohol Use: Yes     Comment: socially prior to pregnancy    Allergies: No Known Allergies  Meds:  Prescriptions prior to admission  Medication Sig  Dispense Refill Last Dose  . acyclovir (ZOVIRAX) 400 MG tablet Take 1 tablet (400 mg total) by mouth 2 (two) times daily. 60 tablet 2 Taking  . docusate sodium (COLACE) 100 MG capsule Take 100 mg by mouth 2 (two) times daily as needed for mild constipation.   Taking  . Prenatal Vit-Fe Fumarate-FA (PRENATAL MULTIVITAMIN) TABS tablet Take 1 tablet by mouth daily at 12 noon.   Taking    ROS: Pertinent findings in history of present illness.  Physical Exam  Blood pressure 121/72, pulse 123, temperature 98.1 F (36.7 C), temperature source Oral, resp. rate 18, height 5' 5.5" (1.664 m), weight 63.957 kg (141 lb), last menstrual period 06/18/2014. GENERAL: Well-developed, well-nourished female in no acute distress.  HEENT: normocephalic HEART: normal rate RESP: normal effort ABDOMEN: Soft, non-tender, gravid appropriate for gestational age  EXTREMITIES: Nontender, no edema NEURO: alert and oriented Dilation: 1 Effacement (%): 30 Cervical Position: Posterior Station: -3 Presentation: Vertex Exam by:: bp  FHT:  Baseline 150 , moderate variability, accelerations present, no decelerations Contractions: irregular   Labs: Results for orders placed or performed during the hospital encounter of 03/18/15 (from the past 24 hour(s))  CBC     Status: Abnormal   Collection Time: 03/18/15 10:05 AM  Result Value Ref Range   WBC 5.5 4.0 - 10.5 K/uL   RBC 3.50 (L) 3.87 - 5.11 MIL/uL   Hemoglobin 11.9 (L) 12.0 - 15.0 g/dL   HCT 96.0 (L) 45.4 - 09.8 %   MCV 100.9 (H) 78.0 - 100.0 fL   MCH 34.0 26.0 - 34.0 pg   MCHC 33.7 30.0 - 36.0 g/dL   RDW 11.9 14.7 - 82.9 %   Platelets 206 150 - 400 K/uL    Imaging:  [place] Mfm Fetal Bpp Wo Non Stress  03/10/2015  OBSTETRICAL ULTRASOUND: This exam was performed within a [place] Ultrasound Department. The OB US report was generated in the AS system, and faxed to the ordering physician.  This report is available in the [employer]. See the AS Obstetric US report  via the Image Link.  [place] Mfm Fetal Bpp Wo Non Stress  03/03/2015  OBSTETRICAL ULTRASOUND: This exam was performed within a [place] Ultrasound Department. The OB US report was generated in the AS system, and faxed to the ordering physician.  This report is available in the [employer]. See the AS Obstetric US report via the Image Link.  [place] Mfm Fetal Bpp Wo Non Stress  02/24/2015  OBSTETRICAL ULTRASOUND: This exam was performed within a [place] Ultrasound Department. The OB US report was generated in the AS system, and faxed to the ordering physician.  This report is available in the [employer]. See the AS Obstetric US report via the Image Link.  [place] Mfm Ob Comp + 14 Wk  02/24/2015  OBSTETRICAL ULTRASOUND: This exam was performed within a [place] [facility]trasound Department. The OB US report was generated in the AS system, and faxed to the ordering physician.  This report is available in the [employer]. See the AS Obstetric US report via the Image Link.  [place] Mfm Ua Cord Doppler  03/10/2015  OBSTETRICAL ULTRASOUND: This exam was performed within a [place] Ultrasound Department. The OB US report was generated in the AS system, and faxed to the ordering physician.  This report is available in the [employer]. See the AS Obstetric US report via the Image Link.  [place] Mfm Ua Cord Doppler  03/03/2015  OBSTETRICAL ULTRASOUND: This exam was performed within a [place] Ultrasound Department. The OB US report was generated in the AS system, and faxed to the ordering physician.  This report is available in the [employer]. See the AS Obstetric US report via the Image Link.  [place] Mfm Ua Cord Doppler  02/24/2015  OBSTETRICAL ULTRASOUND: This exam was performed within a  Ultrasound Department. The OB US report was generated in the AS system, and faxed to the ordering physician.  This report is available in the [employer]. See the AS Obstetric US report via the Image Link.  Assessment: 1.  Labor: IOL for IUGR 2. Fetal Wellbeing: Category 1  3. Pain Control: Desires Epidural 4. GBS: neg 5. 39.0 week IUP  Plan:  1. Admit to BS for IOL 2. Routine L&D orders 3. Analgesia/anesthesia PRN  4. Initiate cervical ripening w/ cytotec (Bishop Score 2-3)      Medication List    ASK your doctor about these medications        acyclovir 400 MG tablet  Commonly known as:  ZOVIRAX  Take 1 tablet (400 mg total) by mouth 2 (two) times daily.     docusate sodium 100 MG capsule  Commonly known as:  COLACE  Take 100 mg by mouth 2 (two) times daily as needed for mild constipation.     prenatal multivitamin Tabs tablet  Take 1 tablet by mouth daily at 12 noon.        Kim Delton, MD 03/18/2015 11:37 AM  OB fellow attestation:  I have seen and examined this patient; I agree with above documentation in the resident's note.   Kim Flowers is a 24 y.o. G2P1001 here for IOL  PE: BP 121/72 mmHg  Pulse 123  Temp(Src) 98.1 F (36.7 C) (Oral)  Resp 18  Ht 5' 5.5" (1.664 m)  Wt 141 lb (63.957 kg)  BMI 23.10 kg/m2  LMP 06/18/2014 (Exact Date) Gen: calm comfortable, NAD Resp: normal effort, no distress Abd: gravid  ROS, labs, PMH reviewed  Plan:  1. Admit to BS for IOL for IUGR, noted nuchal x3 on [place]. EFW 15th% but AC lag in 3rd% 2. Routine L&D orders 3. Analgesia/anesthesia PRN  4. Initiate cervical ripening w/ cytotec (Bishop Score 2-3)  Federico Flake, MD  Family Medicine, OB Fellow 03/18/2015, 12:52 PM

## 2015-03-18 NOTE — Progress Notes (Signed)
Kim StablerChristina Flowers is a 24 y.o. G2P1001 at 5759w0d by LMP admitted for induction of labor due to Poor fetal growth at 15%ile, and with nuchal cord x 3. Pt and FOB were unaware of the 3-[place].  The role of [place] x3  Reviewed. .  Subjective: Pt with one dose cytotec , 4 hrs ago, cramping mildly. Foley bulb ripening explained. And accepted.  Objective: BP 99/57 mmHg  Pulse 91  Temp(Src) 98.1 F (36.7 C) (Oral)  Resp 18  Ht 5' 5.5" (1.664 m)  Wt 141 lb (63.957 kg)  BMI 23.10 kg/m2  LMP 06/18/2014 (Exact Date)      FHT:  FHR: 145 bpm, variability: minimal ,  accelerations:  Present,  decelerations:  Absent UC:   irregular, every 3-6 minutes SVE:   Dilation: 2 Effacement (%): 50 Station: -3 Exam by:: dr newton reexam by me confirms this, and foley bulb x 60 cc inflated. Labs: Lab Results  Component Value Date   WBC 5.5 03/18/2015   HGB 11.9* 03/18/2015   HCT 35.3* 03/18/2015   MCV 100.9* 03/18/2015   PLT 206 03/18/2015    Assessment / Plan: Induction of labor due to SGA infant, nuchal cord x3,  progressing well on pitocin  Labor: Progressing normally and ripening with foley to begin Preeclampsia:   Fetal Wellbeing:  Category I Pain Control:  Fentanyl I/D:  n/a Anticipated MOD:  NSVD  Kim Flowers 03/18/2015, 9:18 PM

## 2015-03-19 ENCOUNTER — Encounter (HOSPITAL_COMMUNITY): Payer: Self-pay

## 2015-03-19 DIAGNOSIS — Z3A39 39 weeks gestation of pregnancy: Secondary | ICD-10-CM

## 2015-03-19 LAB — RPR: RPR: NONREACTIVE

## 2015-03-19 MED ORDER — ACETAMINOPHEN 325 MG PO TABS: 650.0000 mg | ORAL_TABLET | ORAL | Status: DC | PRN

## 2015-03-19 MED ORDER — PRENATAL MULTIVITAMIN CH
1.0000 | ORAL_TABLET | Freq: Every day | ORAL | Status: DC
  Administered 2015-03-19 – 2015-03-20 (×2): 1 via ORAL
  Filled 2015-03-19 (×2): qty 1

## 2015-03-19 MED ORDER — ONDANSETRON HCL 4 MG PO TABS: 4.0000 mg | ORAL_TABLET | ORAL | Status: DC | PRN

## 2015-03-19 MED ORDER — SENNOSIDES-DOCUSATE SODIUM 8.6-50 MG PO TABS
2.0000 | ORAL_TABLET | ORAL | Status: DC
  Administered 2015-03-19: 2 via ORAL
  Filled 2015-03-19: qty 2

## 2015-03-19 MED ORDER — OXYCODONE-ACETAMINOPHEN 5-325 MG PO TABS
1.0000 | ORAL_TABLET | ORAL | Status: DC | PRN
Start: 2015-03-19 — End: 2015-03-20
  Administered 2015-03-19: 1 via ORAL
  Filled 2015-03-19: qty 1

## 2015-03-19 MED ORDER — LANOLIN HYDROUS EX OINT: TOPICAL_OINTMENT | CUTANEOUS | Status: DC | PRN

## 2015-03-19 MED ORDER — TETANUS-DIPHTH-ACELL PERTUSSIS 5-2.5-18.5 LF-MCG/0.5 IM SUSP: 0.5000 mL | Freq: Once | INTRAMUSCULAR | Status: DC

## 2015-03-19 MED ORDER — NALOXONE HCL 0.4 MG/ML IJ SOLN
INTRAMUSCULAR | Status: AC
  Filled 2015-03-19: qty 1

## 2015-03-19 MED ORDER — DIPHENHYDRAMINE HCL 50 MG/ML IJ SOLN: 12.5000 mg | INTRAMUSCULAR | Status: DC | PRN

## 2015-03-19 MED ORDER — WITCH HAZEL-GLYCERIN EX PADS: 1.0000 "application " | MEDICATED_PAD | CUTANEOUS | Status: DC | PRN

## 2015-03-19 MED ORDER — DIPHENHYDRAMINE HCL 25 MG PO CAPS: 25.0000 mg | ORAL_CAPSULE | Freq: Four times a day (QID) | ORAL | Status: DC | PRN

## 2015-03-19 MED ORDER — IBUPROFEN 600 MG PO TABS
600.0000 mg | ORAL_TABLET | Freq: Four times a day (QID) | ORAL | Status: DC
  Administered 2015-03-19 – 2015-03-20 (×6): 600 mg via ORAL
  Filled 2015-03-19 (×6): qty 1

## 2015-03-19 MED ORDER — DIBUCAINE 1 % RE OINT: 1.0000 "application " | TOPICAL_OINTMENT | RECTAL | Status: DC | PRN

## 2015-03-19 MED ORDER — PHENYLEPHRINE 40 MCG/ML (10ML) SYRINGE FOR IV PUSH (FOR BLOOD PRESSURE SUPPORT): 80.0000 ug | PREFILLED_SYRINGE | INTRAVENOUS | Status: DC | PRN

## 2015-03-19 MED ORDER — FENTANYL CITRATE (PF) 100 MCG/2ML IJ SOLN
100.0000 ug | INTRAMUSCULAR | Status: DC | PRN
  Administered 2015-03-19 (×2): 100 ug via INTRAVENOUS
  Filled 2015-03-19 (×2): qty 2

## 2015-03-19 MED ORDER — ONDANSETRON HCL 4 MG/2ML IJ SOLN: 4.0000 mg | INTRAMUSCULAR | Status: DC | PRN

## 2015-03-19 MED ORDER — FENTANYL 2.5 MCG/ML BUPIVACAINE 1/10 % EPIDURAL INFUSION (WH - ANES): 14.0000 mL/h | INTRAMUSCULAR | Status: DC | PRN

## 2015-03-19 MED ORDER — BENZOCAINE-MENTHOL 20-0.5 % EX AERO: 1.0000 "application " | INHALATION_SPRAY | CUTANEOUS | Status: DC | PRN

## 2015-03-19 MED ORDER — ZOLPIDEM TARTRATE 5 MG PO TABS: 5.0000 mg | ORAL_TABLET | Freq: Every evening | ORAL | Status: DC | PRN

## 2015-03-19 MED ORDER — EPHEDRINE 5 MG/ML INJ: 10.0000 mg | INTRAVENOUS | Status: DC | PRN

## 2015-03-19 MED ORDER — OXYCODONE-ACETAMINOPHEN 5-325 MG PO TABS: 2.0000 | ORAL_TABLET | ORAL | Status: DC | PRN

## 2015-03-19 MED ORDER — SIMETHICONE 80 MG PO CHEW: 80.0000 mg | CHEWABLE_TABLET | ORAL | Status: DC | PRN

## 2015-03-19 NOTE — Lactation Note (Signed)
This note was copied from the chart of Kim Flowers. Lactation Consultation Note  Patient Name: Kim Doran StablerChristina Dunigan RUEAV'WToday's Date: 03/19/2015 Reason for consult: Initial assessment;Infant < 6lbs Baby sleepy at this visit, Mom reports baby has been spitty today. Mom reports baby has latched few good times this am. Mom has flat nipples, will become erect with stimulation but then flatten again with trying to latch. Gave Mom breasts shells to wear and [place] hand pump to pre-pump to help with latch. Basic teaching reviewed. Continue to BF with feeding ques, Lactation brochure left for review, advised of OP services and support group. Mom has lots of colostrum with hand expression. This baby also has short, labial frenulum and possible lingual frenulum. Was not able to do good oral exam at this visit. Encouraged to call for assist as needed.    Maternal Data Does the patient have breastfeeding experience prior to this delivery?: Yes  Feeding Feeding Type: Breast Fed Length of feed: 0 min  LATCH Score/Interventions Latch: Too sleepy or reluctant, no latch achieved, no sucking elicited.     Type of Nipple: Flat  Comfort (Breast/Nipple): Soft / non-tender           Lactation Tools Discussed/Used Tools: Pump;Shells Shell Type: Inverted Breast pump type: Manual   Consult Status Consult Status: Follow-up Date: 03/20/15 Follow-up type: In-patient    Alfred LevinsGranger, Davine Sweney Ann 03/19/2015, 4:16 PM

## 2015-03-19 NOTE — Consult Note (Signed)
Neonatology Note:  Attendance at Code Apgar:   Our team responded to a Code Apgar call to room # 176 following NSVD, due to infant with apnea. The requesting physician was Philipp DeputyKim Shaw, CNM for Dr. Emelda FearFerguson. The mother is a G2P1 AB pos, GBS neg being induced due to known IUGR, with precipitous labor. She has a history of HSV, on Valtrex suppression, without current lesions. ROM occurred 5 minutes PTD and the fluid was clear. Mother had gotten IV Fentanyl 1, 2, and 6 hours prior to delivery, in addition to epidural anesthesia. No history of illicit drug use. No fetal distress during labor. At delivery, the baby had a CAN times 1 and was apneic and floppy. The OB nursing staff in attendance gave vigorous stimulation and a Code Apgar was called. Our team arrived at 1 minute of life, at which time the baby was blue, apneic, with a HR of 45. PPV was started by Marita KansasJ. Kelso, RT and continued by me due to continued HR < 100 and minimal respiratory effort, until about 3 minutes.  The baby began to cry some at that time and maintained his HR thereafter. After obtaining the history of maternal Fentanyl administration above, we gave Narcan 0.75 ml IM in the LAT, at 5 minutes of age. The baby perked up quickly, with lusty crying and good color. O2 saturations were in the 90s in room air and his lungs were clear. Ap 2/7/9. I spoke with the parents in the DR, then transferred the baby to the Pediatrician's care.   Doretha Souhristie C. Kayler Buckholtz, MD

## 2015-03-20 MED ORDER — IBUPROFEN 600 MG PO TABS: 600.0000 mg | ORAL_TABLET | Freq: Four times a day (QID) | ORAL | Status: AC

## 2015-03-20 NOTE — Discharge Summary (Signed)
OB Discharge Summary  Patient Name: Kim Flowers DOB: [birth year] MRN: [id number]  Date of admission: 03/18/2015 Delivering MD: Cam Hai D   Date of discharge: 03/20/2015  Admitting diagnosis: INDUCTION Intrauterine pregnancy: [redacted]w[redacted]d     Secondary diagnosis: None     Discharge diagnosis: Term Pregnancy Delivered                                                                                                Post partum procedures:none  Augmentation: Pitocin  Complications: None  Hospital course:  Induction of Labor With Vaginal Delivery   24 y.o. yo [id number] at [redacted]w[redacted]d was admitted to the hospital 03/18/2015 for induction of labor.  Indication for induction: IUGR.  Patient had an uncomplicated labor course as follows: Membrane Rupture Time/Date: 3:40 AM ,03/19/2015   Intrapartum Procedures: Episiotomy: None [1]                                         Lacerations:  None [1]  Patient had delivery of a Viable infant.  Information for the patient's newborn:  Katelan, Hirt [[id number]]  Delivery Method: Vaginal, Spontaneous Delivery (Filed from Delivery Summary)   03/19/2015  Details of delivery can be found in separate delivery note.  Patient had a routine postpartum course. Patient is discharged home No discharge date for patient encounter.    Physical exam  Filed Vitals:   03/19/15 1653 03/19/15 2112 03/20/15 0529 03/20/15 1200  BP: 96/59 102/53 101/59 107/66  Pulse: 88 83 67 88  Temp: 98.5 F (36.9 C) 98.8 F (37.1 C) 98.8 F (37.1 C) 98.9 F (37.2 C)  TempSrc: Oral Oral Oral Oral  Resp: Height:      Weight:      SpO2: 100% 100% 100% 100%   General: alert, cooperative and no distress Lochia: appropriate Uterine Fundus: firm Incision: N/A DVT Evaluation: No evidence of DVT seen on physical exam. Negative Homan's sign. No cords or calf tenderness. Labs: Lab Results  Component Value Date   WBC 5.5 03/18/2015   HGB 11.9*  03/18/2015   HCT 35.3* 03/18/2015   MCV 100.9* 03/18/2015   PLT 206 03/18/2015   No flowsheet data found.  Discharge instruction: per After Visit Summary and "Baby and Me Booklet".  Medications:  Current facility-administered medications:  .  acetaminophen (TYLENOL) tablet 650 mg, 650 mg, Oral, Q4H PRN, Arabella Merles, CNM .  benzocaine-Menthol (DERMOPLAST) 20-0.5 % topical spray 1 application, 1 application, Topical, PRN, Arabella Merles, CNM .  witch hazel-glycerin (TUCKS) pad 1 application, 1 application, Topical, PRN **AND** dibucaine (NUPERCAINAL) 1 % rectal ointment 1 application, 1 application, Rectal, PRN, Arabella Merles, CNM .  diphenhydrAMINE (BENADRYL) capsule 25 mg, 25 mg, Oral, Q6H PRN, Arabella Merles, CNM .  ibuprofen (ADVIL,MOTRIN) tablet 600 mg, 600 mg, Oral, 4 times per day, Arabella Merles, CNM, 600 mg at 03/20/15 1145 .  lanolin ointment, , Topical, PRN, Arabella Merles,  CNM .  ondansetron (ZOFRAN) tablet 4 mg, 4 mg, Oral, Q4H PRN **OR** ondansetron (ZOFRAN) injection 4 mg, 4 mg, Intravenous, Q4H PRN, Arabella MerlesKimberly D Shaw, CNM .  oxyCODONE-acetaminophen (PERCOCET/ROXICET) 5-325 MG per tablet 1 tablet, 1 tablet, Oral, Q4H PRN, Arabella MerlesKimberly D Shaw, CNM, 1 tablet at 03/19/15 [phone] .  oxyCODONE-acetaminophen (PERCOCET/ROXICET) 5-325 MG per tablet 2 tablet, 2 tablet, Oral, Q4H PRN, Arabella MerlesKimberly D Shaw, CNM .  prenatal multivitamin tablet 1 tablet, 1 tablet, Oral, Q1200, Arabella MerlesKimberly D Shaw, CNM, 1 tablet at 03/20/15 1145 .  senna-docusate (Senokot-S) tablet 2 tablet, 2 tablet, Oral, Q24H, Arabella MerlesKimberly D Shaw, CNM, 2 tablet at 03/19/15 2348 .  simethicone (MYLICON) chewable tablet 80 mg, 80 mg, Oral, PRN, Arabella MerlesKimberly D Shaw, CNM .  Tdap (BOOSTRIX) injection 0.5 mL, 0.5 mL, Intramuscular, Once, Arabella MerlesKimberly D Shaw, CNM, 0.5 mL at 03/20/15 0954 .  zolpidem (AMBIEN) tablet 5 mg, 5 mg, Oral, QHS PRN, Arabella MerlesKimberly D Shaw, CNM  Diet: routine diet  Activity: Advance as tolerated. Pelvic rest for 6 weeks.    Outpatient follow up:6 weeks  Postpartum contraception: Undecided  Newborn Data: Live born female  Birth Weight: 5 lb 11.9 oz (2605 g) APGAR: 2, 7  Baby Feeding: Bottle and Breast Disposition:home with mother   03/20/2015 Ferdie PingLAWSON, Joven Mom DARLENE, CNM

## 2015-03-20 NOTE — Progress Notes (Signed)
Discharge teaching complete. Pt understood all information and did not have any questions. Pt ambulated out of the hospital and discharged home to family.  

## 2015-03-20 NOTE — Progress Notes (Signed)
Post Partum Day 1 Subjective: no complaints, up ad lib, voiding, tolerating PO and + flatus  Objective: Blood pressure 101/59, pulse 67, temperature 98.8 F (37.1 C), temperature source Oral, resp. rate 18, height 5' 5.5" (1.664 m), weight 141 lb (63.957 kg), last menstrual period 06/18/2014, SpO2 100 %, unknown if currently breastfeeding.  Physical Exam:  General: alert, cooperative, appears stated age and no distress Lochia: appropriate Uterine Fundus: firm Incision: n/a DVT Evaluation: No evidence of DVT seen on physical exam. Negative Homan's sign. No cords or calf tenderness.   Recent Labs  03/18/15 1005  HGB 11.9*  HCT 35.3*    Assessment/Plan: Plan for discharge tomorrow   LOS: 2 days   Lalita Ebel DARLENE 03/20/2015, 9:16 AM

## 2015-03-20 NOTE — Lactation Note (Addendum)
This note was copied from the chart of Kim Kim Flowers. Lactation Consultation Note  Patient Name: Kim Flowers ZOXWR'UToday's Date: 03/20/2015 Reason for consult: Follow-up assessment  Baby is 6336 hours old , voids and tools adequate for age,  Increased weight today , breast feeding consistently , bilicheck WNL .  LC reviewed sore nipple and engorgement prevention and tx . Mom has a hand pump,  Shells , comfort gels , and DEBP at home.  LC recommended due to semi inverted nipples bilaterally breast shells Between feedings. Prior to latch breast massage , hand express, pre- pump with hand pump and roll nipple. Baby has a wide Based mouth which aids in the latch.  Baby awake and hungry at consult . LC assisted mom finding a position that works better for both.  Football for both breast worked well, depth obtained , multiply swallows noted , increased with breast compressions. See Doc flow sheets for details.  Mother informed of post-discharge support and given phone number to the lactation department, including services for phone call assistance; out-patient appointments; and breastfeeding support group. List of other breastfeeding resources in the community given in the handout. Encouraged mother to call for problems or concerns related to breastfeeding.   Maternal Data Has patient been taught Hand Expression?: Yes Does the patient have breastfeeding experience prior to this delivery?: Yes  Feeding Feeding Type: Breast Fed Length of feed: 10 min  LATCH Score/Interventions Latch: Grasps breast easily, tongue down, lips flanged, rhythmical sucking. Intervention(s): Skin to skin;Teach feeding cues;Waking techniques Intervention(s): Adjust position;Assist with latch;Breast massage;Breast compression  Audible Swallowing: Spontaneous and intermittent  Type of Nipple: Everted at rest and after stimulation  Comfort (Breast/Nipple): Filling, red/small blisters or bruises, mild/mod  discomfort     Hold (Positioning): Assistance needed to correctly position infant at breast and maintain latch. Intervention(s): Breastfeeding basics reviewed;Support Pillows;Position options;Skin to skin  LATCH Score: 8  Lactation Tools Discussed/Used Tools: Shells;Pump;Comfort gels Shell Type: Inverted Breast pump type: Manual WIC Program: No   Consult Status Consult Status: Complete ([facility] Date: 03/20/15 Follow-up type: In-patient    Kathrin Greathouseorio, Angy Swearengin Ann 03/20/2015, 3:58 PM

## 2015-03-20 NOTE — Progress Notes (Signed)
Pt told repeatedly not to sleep with the baby in the bed and to put baby in bassinet if she is feeling tired. Mom continues to sleep with the baby in the bed.

## 2015-03-24 ENCOUNTER — Inpatient Hospital Stay (HOSPITAL_COMMUNITY)
Admission: AD | Admit: 2015-03-24 | Discharge: 2015-03-24 | Disposition: A | Discharge status: Home / Self Care | Payer: Medicaid Other | Source: Ambulatory Visit | Attending: Obstetrics & Gynecology | Admitting: Obstetrics & Gynecology

## 2015-03-24 ENCOUNTER — Encounter (HOSPITAL_COMMUNITY): Payer: Self-pay

## 2015-03-24 DIAGNOSIS — N853 Subinvolution of uterus: Secondary | ICD-10-CM

## 2015-03-24 DIAGNOSIS — O9989 Other specified diseases and conditions complicating pregnancy, childbirth and the puerperium: Secondary | ICD-10-CM | POA: Diagnosis not present

## 2015-03-24 DIAGNOSIS — O9089 Other complications of the puerperium, not elsewhere classified: Secondary | ICD-10-CM

## 2015-03-24 DIAGNOSIS — N858 Other specified noninflammatory disorders of uterus: Secondary | ICD-10-CM | POA: Insufficient documentation

## 2015-03-24 DIAGNOSIS — R252 Cramp and spasm: Secondary | ICD-10-CM | POA: Diagnosis not present

## 2015-03-24 DIAGNOSIS — IMO0001 Reserved for inherently not codable concepts without codable children: Secondary | ICD-10-CM

## 2015-03-24 MED ORDER — IBUPROFEN 800 MG PO TABS
800.0000 mg | ORAL_TABLET | Freq: Once | ORAL | Status: AC
  Administered 2015-03-24: 800 mg via ORAL
  Filled 2015-03-24: qty 1

## 2015-03-24 NOTE — Discharge Instructions (Signed)

## 2015-03-24 NOTE — MAU Provider Note (Signed)
History     CSN: [id number]  Arrival date and time: 03/24/15 0446   First Provider Initiated Contact with Patient 03/24/15 0534      Chief Complaint  Patient presents with  . Abdominal Pain   HPI Comments: Kim Flowers is a 24 y.o. A1P3790G2P2002 who is S/P NSVD on 03/19/15. She states that she has been having cramps. She states that after nursing the baby they became unbearable and rated them a 10/10 at that time. She states that they have gotten better since being here. She states that her bleeding is light, and she denies any fever.   Abdominal Pain This is a new problem. The current episode started today. The onset quality is gradual. The problem occurs constantly. The problem has been waxing and waning. The pain is located in the suprapubic region. The pain is at a severity of 6/10. The quality of the pain is cramping. The abdominal pain radiates to the perineum. Pertinent negatives include no constipation, diarrhea, dysuria, fever, frequency, nausea or vomiting. The pain is aggravated by movement and being still. The pain is relieved by nothing. Treatments tried: ibuprofen. Last took at 1800 yesterday  The treatment provided no relief.    Past Medical History  Diagnosis Date  . Medical history non-contributory   . 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 No past surgeries      Family History  Problem Relation Age of Onset  . Cancer Mother   . Heart disease Father   . Diabetes Paternal Grandfather     Social History  Substance Use Topics  . Smoking status: Never Smoker   . Smokeless tobacco: Never Used  . Alcohol Use: Yes     Comment: socially prior to pregnancy    Allergies: No Known Allergies  Prescriptions prior to admission  Medication Sig Dispense Refill Last Dose  . ibuprofen (ADVIL,MOTRIN) 600 MG tablet Take 1 tablet (600 mg total) by mouth every 6 (six) hours. 30 tablet 0 03/23/2015 at 1800  . acyclovir  (ZOVIRAX) 400 MG tablet Take 1 tablet (400 mg total) by mouth 2 (two) times daily. 60 tablet 2 03/17/2015 at Unknown time  . docusate sodium (COLACE) 100 MG capsule Take 100 mg by mouth 2 (two) times daily as needed for mild constipation.   03/16/2015 at Unknown time  . Prenatal Vit-Fe Fumarate-FA (PRENATAL MULTIVITAMIN) TABS tablet Take 1 tablet by mouth daily at 12 noon.   03/17/2015 at Unknown time    Review of Systems  Constitutional: Negative for fever.  Gastrointestinal: Positive for abdominal pain. Negative for nausea, vomiting, diarrhea and constipation.  Genitourinary: Negative for dysuria, urgency and frequency.   Physical Exam   Blood pressure 105/62, pulse 74, temperature 98.7 F (37.1 C), temperature source Oral, resp. rate 16, height 5' 4.5" (1.638 m), weight 60.51 kg (133 lb 6.4 oz), last menstrual period 06/18/2014, SpO2 100 %, unknown if currently breastfeeding.  Physical Exam  Nursing note and vitals reviewed. Constitutional: She is oriented to person, place, and time. She appears well-developed and well-nourished. No distress.  HENT:  Head: Normocephalic.  Cardiovascular: Normal rate.   Respiratory: Effort normal.  GI: Soft. There is no tenderness.  Genitourinary:  Fundus: just above SP, non-tender. Bleeding is scant.   Neurological: She is alert and oriented to person, place, and time.  Skin: Skin is warm and dry.  Psychiatric: She has a normal mood and affect.  MAU Course  Procedures  MDM Patient has had  ibuprofen here. She reports that her pain has decreased.   Assessment and Plan   1. Involution of uterus   2. Postpartum cramps    DC home Comfort measures reviewed  Return precautions  Bleeding precautions RX: continue to take ibuprofen as prescribed. Take on a schedule.  Return to MAU as needed FU with OB as planned  Follow-up Information    Follow up with [facility]   Specialty:  Obstetrics and Gynecology   Why:  As  scheduled   Contact information:   [address] [place] [address] [phone]        Tawnya Crook 03/24/2015, 5:40 AM

## 2015-03-24 NOTE — MAU Note (Signed)
Pt c/o cramping that started an hour ago while breastfeeding. Delivered SVD on Saturday. Took ibuprofen at 1800. States that she has not been able to stand up straight up. States bleeding is okay.

## 2015-04-21 ENCOUNTER — Encounter: Payer: Self-pay | Admitting: Medical

## 2015-04-21 ENCOUNTER — Other Ambulatory Visit (HOSPITAL_COMMUNITY)
Admission: RE | Admit: 2015-04-21 | Discharge: 2015-04-21 | Disposition: A | Discharge status: Home / Self Care | Payer: Medicaid Other | Source: Ambulatory Visit | Attending: Obstetrics and Gynecology | Admitting: Obstetrics and Gynecology

## 2015-04-21 ENCOUNTER — Ambulatory Visit (INDEPENDENT_AMBULATORY_CARE_PROVIDER_SITE_OTHER): Payer: Medicaid Other | Admitting: Medical

## 2015-04-21 DIAGNOSIS — Z975 Presence of (intrauterine) contraceptive device: Secondary | ICD-10-CM

## 2015-04-21 DIAGNOSIS — Z124 Encounter for screening for malignant neoplasm of cervix: Secondary | ICD-10-CM | POA: Diagnosis not present

## 2015-04-21 DIAGNOSIS — Z3202 Encounter for pregnancy test, result negative: Secondary | ICD-10-CM

## 2015-04-21 DIAGNOSIS — Z01419 Encounter for gynecological examination (general) (routine) without abnormal findings: Secondary | ICD-10-CM | POA: Insufficient documentation

## 2015-04-21 DIAGNOSIS — Z30430 Encounter for insertion of intrauterine contraceptive device: Secondary | ICD-10-CM | POA: Diagnosis not present

## 2015-04-21 DIAGNOSIS — M2550 Pain in unspecified joint: Secondary | ICD-10-CM | POA: Diagnosis not present

## 2015-04-21 LAB — POCT PREGNANCY, URINE
PREG TEST UR: NEGATIVE
Preg Test, Ur: NEGATIVE

## 2015-04-21 MED ORDER — LEVONORGESTREL 18.6 MCG/DAY IU IUD
INTRAUTERINE_SYSTEM | Freq: Once | INTRAUTERINE | Status: AC
  Administered 2015-04-21: 16:00:00 via INTRAUTERINE

## 2015-04-21 NOTE — Progress Notes (Signed)
Patient ID: Kim StablerChristina Cahall, female   DOB: April 30, 1991, 24 y.o.   MRN: [id number] Subjective:     Kim Flowers is a 24 y.o. female who presents for a postpartum visit. She is 5 weeks postpartum following a spontaneous vaginal delivery. I have fully reviewed the prenatal and intrapartum course. The delivery was at 39 gestational weeks. Outcome: spontaneous vaginal delivery. Anesthesia: none. Postpartum course has been normal. Baby's course has been normal. Baby is feeding by breast. Bleeding staining only. Bowel function is abnormal: mild, intermittent constipation. Bladder function is normal. Patient is not sexually active. Contraception method is none. Postpartum depression screening: negative. Patient states issues with joint pain in her hands, worse in the morning and not relieved by NSAIDs.   The following portions of the patient's history were reviewed and updated as appropriate: allergies, current medications, past family history, past medical history, past social history, past surgical history and problem list.  Review of Systems Pertinent items are noted in HPI.   Objective:    BP 117/80 mmHg  Pulse 60  Temp(Src) 98.4 F (36.9 C)  Ht 5' 5.5" (1.664 m)  Wt 138 lb 1.6 oz (62.642 kg)  BMI 22.62 kg/m2  Breastfeeding? Yes  General:  alert and cooperative   Breasts:  not performed  Lungs: normal effort  Heart:  normal rate  Abdomen: soft   Vulva:  normal  Vagina: normal vagina, no discharge, exudate, lesion, or erythema  Cervix:  normal contour, no lesions  Corpus: normal  Adnexa:  not evaluated  Rectal Exam: Not performed.         GYNECOLOGY CLINIC PROCEDURE NOTE  IUD Insertion Procedure Note Patient identified, informed consent performed.  Discussed risks of irregular bleeding, cramping, infection, malpositioning or misplacement of the IUD outside the uterus which may require further procedure such as laparoscopy. Time out was performed.  Urine pregnancy test  negative.  Speculum placed in the vagina.  Cervix visualized.  Cleaned with Betadine x 2.  Grasped anteriorly with a single tooth tenaculum.  Uterus sounded to 6 cm.  Mirena IUD placed per manufacturer's recommendations.  Strings trimmed to 3 cm. Tenaculum was removed, good hemostasis noted.  Patient tolerated procedure well.   Patient was given post-procedure instructions.  She was advised to be have backup contraception for one week.  Patient was also asked to check IUD strings periodically and follow up in 4 weeks for IUD check.  Assessment:     Normal  postpartum exam. Pap smear done at today's visit.  IUD insertion  Plan:    1. Contraception: IUD 2. Patient referred to [facility] for joint pain and constipation. Prior to that appointment advised continued NSAIDs use, increased water intake and continued Colace PRN 3. Follow up in: 4 weeks for string check or sooner as needed.   Marny LowensteinJulie N Kaylor Maiers, PA-C  04/21/2015 3:41 PM

## 2015-04-21 NOTE — Patient Instructions (Addendum)
Vaginal Delivery, Care After Refer to this sheet in the next few weeks. These discharge instructions provide you with information on caring for yourself after delivery. Your health care provider may also give you specific instructions. Your treatment has been planned according to the most current medical practices available, but problems sometimes occur. Call your health care provider if you have any problems or questions after you go home. HOME CARE INSTRUCTIONS  Take over-the-counter or prescription medicines only as directed by your health care provider or pharmacist.  Do not drink alcohol, especially if you are breastfeeding or taking medicine to relieve pain.  Do not chew or smoke tobacco.  Do not use illegal drugs.  Continue to use good perineal care. Good perineal care includes:  Wiping your perineum from front to back.  Keeping your perineum clean.  Do not use tampons or douche until your health care provider says it is okay.  Shower, wash your hair, and take tub baths as directed by your health care provider.  Wear a well-fitting bra that provides breast support.  Eat healthy foods.  Drink enough fluids to keep your urine clear or pale yellow.  Eat high-fiber foods such as whole grain cereals and breads, brown rice, beans, and fresh fruits and vegetables every day. These foods may help prevent or relieve constipation.  Follow your health care provider's recommendations regarding resumption of activities such as climbing stairs, driving, lifting, exercising, or traveling.  Talk to your health care provider about resuming sexual activities. Resumption of sexual activities is dependent upon your risk of infection, your rate of healing, and your comfort and desire to resume sexual activity.  Try to have someone help you with your household activities and your newborn for at least a few days after you leave the hospital.  Rest as much as possible. Try to rest or take a nap  when your newborn is sleeping.  Increase your activities gradually.  Keep all of your scheduled postpartum appointments. It is very important to keep your scheduled follow-up appointments. At these appointments, your health care provider will be checking to make sure that you are healing physically and emotionally. SEEK MEDICAL CARE IF:   You are passing large clots from your vagina. Save any clots to show your health care provider.  You have a foul smelling discharge from your vagina.  You have trouble urinating.  You are urinating frequently.  You have pain when you urinate.  You have a change in your bowel movements.  You have increasing redness, pain, or swelling near your vaginal incision (episiotomy) or vaginal tear.  You have pus draining from your episiotomy or vaginal tear.  Your episiotomy or vaginal tear is separating.  You have painful, hard, or reddened breasts.  You have a severe headache.  You have blurred vision or see spots.  You feel sad or depressed.  You have thoughts of hurting yourself or your newborn.  You have questions about your care, the care of your newborn, or medicines.  You are dizzy or light-headed.  You have a rash.  You have nausea or vomiting.  You were breastfeeding and have not had a menstrual period within 12 weeks after you stopped breastfeeding.  You are not breastfeeding and have not had a menstrual period by the 12th week after delivery.  You have a fever. SEEK IMMEDIATE MEDICAL CARE IF:   You have persistent pain.  You have chest pain.  You have shortness of breath.  You faint.  You   have leg pain.  You have stomach pain.  Your vaginal bleeding saturates two or more sanitary pads in 1 hour.   This information is not intended to replace advice given to you by your health care provider. Make sure you discuss any questions you have with your health care provider.   Document Released: 05/18/2000 Document Revised:  02/09/2015 Document Reviewed: 01/16/2012 Elsevier Interactive Patient Education 2016 [employer] Pap Test WHY AM I HAVING THIS TEST? A pap test is sometimes called a pap smear. It is a screening test that is used to check for signs of cancer of the vagina, cervix, and uterus. The test can also identify the presence of infection or precancerous changes. Your health care provider will likely recommend you have this test done on a regular basis. This test may be done:  Every 3 years, starting at age 24.  Every 5 years, in combination with testing for the presence of human papillomavirus (HPV).  More or less often depending on other medical conditions.  WHAT KIND OF SAMPLE IS TAKEN? Using a small cotton swab, plastic spatula, or brush, your health care provider will collect a sample of cells from the surface of your cervix. Your cervix is the opening to your uterus, also called a womb. Secretions from the cervix and vagina may also be collected. HOW DO I PREPARE FOR THE TEST?  Be aware of where you are in your menstrual cycle. You may be asked to reschedule the test if you are menstruating on the day of the test.  You may need to reschedule if you have a known vaginal infection on the day of the test.  You may be asked to avoid douching or taking a bath the day before or the day of the test.  Some medicines can cause abnormal test results, such as digitalis and tetracycline. Talk with your health care provider before your test if you take one of these medicines. WHAT DO THE RESULTS MEAN? Abnormal test results may indicate a number of health conditions. These may include:  Cancer. Although pap test results cannot be used to diagnose cancer of the cervix, vagina, or uterus, they may suggest the possibility of cancer. Further tests would be required to determine if cancer is present.  Sexually transmitted disease.  Fungal infection.  Parasite infection.  Herpes infection.  A condition  causing or contributing to infertility. It is your responsibility to obtain your test results. Ask the lab or department performing the test when and how you will get your results. Contact your health care provider to discuss any questions you have about your results.   This information is not intended to replace advice given to you by your health care provider. Make sure you discuss any questions you have with your health care provider.   Document Released: 08/11/2002 Document Revised: 06/11/2014 Document Reviewed: 10/12/2013 Elsevier Interactive Patient Education 2016 [employer] Intrauterine Device Insertion, Care After Refer to this sheet in the next few weeks. These instructions provide you with information on caring for yourself after your procedure. Your health care provider may also give you more specific instructions. Your treatment has been planned according to current medical practices, but problems sometimes occur. Call your health care provider if you have any problems or questions after your procedure. WHAT TO EXPECT AFTER THE PROCEDURE Insertion of the IUD may cause some discomfort, such as cramping. The cramping should improve after the IUD is in place. You may have bleeding after the procedure. This is  normal. It varies from light spotting for a few days to menstrual-like bleeding. When the IUD is in place, a string will extend past the cervix into the vagina for 1-2 inches. The strings should not bother you or your partner. If they do, talk to your health care provider.  HOME CARE INSTRUCTIONS  Check your intrauterine device (IUD) to make sure it is in place before you resume sexual activity. You should be able to feel the strings. If you cannot feel the strings, something may be wrong. The IUD may have fallen out of the uterus, or the uterus may have been punctured (perforated) during placement. Also, if the strings are getting longer, it may mean that the IUD is being forced out  of the uterus. You no longer have full protection from pregnancy if any of these problems occur. You may resume sexual intercourse if you are not having problems with the IUD. The copper IUD is considered immediately effective, and the hormone IUD works right away if inserted within 7 days of your period starting. You will need to use a backup method of birth control for 7 days if the IUD in inserted at any other time in your cycle. Continue to check that the IUD is still in place by feeling for the strings after every menstrual period. You may need to take pain medicine such as acetaminophen or ibuprofen. Only take medicines as directed by your health care provider. SEEK MEDICAL CARE IF:  You have bleeding that is heavier or lasts longer than a normal menstrual cycle. You have a fever. You have increasing cramps or abdominal pain not relieved with medicine. You have abdominal pain that does not seem to be related to the same area of earlier cramping and pain. You are lightheaded, unusually weak, or faint. You have abnormal vaginal discharge or smells. You have pain during sexual intercourse. You cannot feel the IUD strings, or the IUD string has gotten longer. You feel the IUD at the opening of the cervix in the vagina. You think you are pregnant, or you miss your menstrual period. The IUD string is hurting your sex partner. MAKE SURE YOU: Understand these instructions. Will watch your condition. Will get help right away if you are not doing well or get worse.   This information is not intended to replace advice given to you by your health care provider. Make sure you discuss any questions you have with your health care provider.   Document Released: 01/17/2011 Document Revised: 03/11/2013 Document Reviewed: 11/09/2012 Elsevier Interactive Patient Education [employer]

## 2015-04-21 NOTE — Progress Notes (Signed)
Referral to [facility] faxed and order entered.

## 2015-04-25 LAB — CYTOLOGY - PAP

## 2015-05-02 ENCOUNTER — Ambulatory Visit (INDEPENDENT_AMBULATORY_CARE_PROVIDER_SITE_OTHER): Payer: Medicaid Other | Admitting: Obstetrics & Gynecology

## 2015-05-02 ENCOUNTER — Encounter: Payer: Self-pay | Admitting: Obstetrics & Gynecology

## 2015-05-02 VITALS — BP 116/78 | HR 92 | Temp 98.4°F | Ht 64.0 in | Wt 142.9 lb

## 2015-05-02 DIAGNOSIS — Z30432 Encounter for removal of intrauterine contraceptive device: Secondary | ICD-10-CM

## 2015-05-02 DIAGNOSIS — T8389XA Other specified complication of genitourinary prosthetic devices, implants and grafts, initial encounter: Secondary | ICD-10-CM

## 2015-05-02 NOTE — Patient Instructions (Signed)
Contraception Choices Contraception (birth control) is the use of any methods or devices to prevent pregnancy. Below are some methods to help avoid pregnancy. HORMONAL METHODS   Contraceptive implant. This is a thin, plastic tube containing progesterone hormone. It does not contain estrogen hormone. Your health care provider inserts the tube in the inner part of the upper arm. The tube can remain in place for up to 3 years. After 3 years, the implant must be removed. The implant prevents the ovaries from releasing an egg (ovulation), thickens the cervical mucus to prevent sperm from entering the uterus, and thins the lining of the inside of the uterus.  Progesterone-only injections. These injections are given every 3 months by your health care provider to prevent pregnancy. This synthetic progesterone hormone stops the ovaries from releasing eggs. It also thickens cervical mucus and changes the uterine lining. This makes it harder for sperm to survive in the uterus.  Birth control pills. These pills contain estrogen and progesterone hormone. They work by preventing the ovaries from releasing eggs (ovulation). They also cause the cervical mucus to thicken, preventing the sperm from entering the uterus. Birth control pills are prescribed by a health care provider.Birth control pills can also be used to treat heavy periods.  Minipill. This type of birth control pill contains only the progesterone hormone. They are taken every day of each month and must be prescribed by your health care provider.  Birth control patch. The patch contains hormones similar to those in birth control pills. It must be changed once a week and is prescribed by a health care provider.  Vaginal ring. The ring contains hormones similar to those in birth control pills. It is left in the vagina for 3 weeks, removed for 1 week, and then a new one is put back in place. The patient must be comfortable inserting and removing the ring  from the vagina.A health care provider's prescription is necessary.  Emergency contraception. Emergency contraceptives prevent pregnancy after unprotected sexual intercourse. This pill can be taken right after sex or up to 5 days after unprotected sex. It is most effective the sooner you take the pills after having sexual intercourse. Most emergency contraceptive pills are available without a prescription. Check with your pharmacist. Do not use emergency contraception as your only form of birth control. BARRIER METHODS   Female condom. This is a thin sheath (latex or rubber) that is worn over the penis during sexual intercourse. It can be used with spermicide to increase effectiveness.  Female condom. This is a soft, loose-fitting sheath that is put into the vagina before sexual intercourse.  Diaphragm. This is a soft, latex, dome-shaped barrier that must be fitted by a health care provider. It is inserted into the vagina, along with a spermicidal jelly. It is inserted before intercourse. The diaphragm should be left in the vagina for 6 to 8 hours after intercourse.  Cervical cap. This is a round, soft, latex or plastic cup that fits over the cervix and must be fitted by a health care provider. The cap can be left in place for up to 48 hours after intercourse.  Sponge. This is a soft, circular piece of polyurethane foam. The sponge has spermicide in it. It is inserted into the vagina after wetting it and before sexual intercourse.  Spermicides. These are chemicals that kill or block sperm from entering the cervix and uterus. They come in the form of creams, jellies, suppositories, foam, or tablets. They do not require a   prescription. They are inserted into the vagina with an applicator before having sexual intercourse. The process must be repeated every time you have sexual intercourse. INTRAUTERINE CONTRACEPTION  Intrauterine device (IUD). This is a T-shaped device that is put in a woman's uterus  during a menstrual period to prevent pregnancy. There are 2 types:  Copper IUD. This type of IUD is wrapped in copper wire and is placed inside the uterus. Copper makes the uterus and fallopian tubes produce a fluid that kills sperm. It can stay in place for 10 years.  Hormone IUD. This type of IUD contains the hormone progestin (synthetic progesterone). The hormone thickens the cervical mucus and prevents sperm from entering the uterus, and it also thins the uterine lining to prevent implantation of a fertilized egg. The hormone can weaken or kill the sperm that get into the uterus. It can stay in place for 3-5 years, depending on which type of IUD is used. PERMANENT METHODS OF CONTRACEPTION  Female tubal ligation. This is when the woman's fallopian tubes are surgically sealed, tied, or blocked to prevent the egg from traveling to the uterus.  Hysteroscopic sterilization. This involves placing a small coil or insert into each fallopian tube. Your doctor uses a technique called hysteroscopy to do the procedure. The device causes scar tissue to form. This results in permanent blockage of the fallopian tubes, so the sperm cannot fertilize the egg. It takes about 3 months after the procedure for the tubes to become blocked. You must use another form of birth control for these 3 months.  Female sterilization. This is when the female has the tubes that carry sperm tied off (vasectomy).This blocks sperm from entering the vagina during sexual intercourse. After the procedure, the man can still ejaculate fluid (semen). NATURAL PLANNING METHODS  Natural family planning. This is not having sexual intercourse or using a barrier method (condom, diaphragm, cervical cap) on days the woman could become pregnant.  Calendar method. This is keeping track of the length of each menstrual cycle and identifying when you are fertile.  Ovulation method. This is avoiding sexual intercourse during ovulation.  Symptothermal  method. This is avoiding sexual intercourse during ovulation, using a thermometer and ovulation symptoms.  Post-ovulation method. This is timing sexual intercourse after you have ovulated. Regardless of which type or method of contraception you choose, it is important that you use condoms to protect against the transmission of sexually transmitted infections (STIs). Talk with your health care provider about which form of contraception is most appropriate for you.   This information is not intended to replace advice given to you by your health care provider. Make sure you discuss any questions you have with your health care provider.   Document Released: 05/21/2005 Document Revised: 05/26/2013 Document Reviewed: 11/13/2012 Elsevier Interactive Patient Education 2016 Elsevier Inc.  

## 2015-05-02 NOTE — Progress Notes (Signed)
Subjective:     Patient ID: Kim Flowers, female   DOB: 10/01/1990, 24 y.o.   MRN: [id number]  [id number] Patient's last menstrual period was 04/21/2015. States Kim Flowers can't sleep and has had weight gain after Mirena insertion 11/17 and wants it removed. [facility]   Review of Systems  Psychiatric/Behavioral: Positive for sleep disturbance and agitation.       Objective:   Physical Exam  Constitutional: Kim Flowers is oriented to person, place, and time. Kim Flowers appears well-developed. No distress.  Pulmonary/Chest: Effort normal.  Genitourinary: Vagina normal. No vaginal discharge found.  String 3 cm, grasped and device removed intact with no difficulty  Neurological: Kim Flowers is alert and oriented to person, place, and time.  Psychiatric: Kim Flowers has a normal mood and affect. Her behavior is normal.       Assessment:     Side effects to Mirena, elects removal     Plan:     Condoms, information on BCM given  Adam PhenixJames G Khaza Blansett, MD 05/02/2015

## 2015-05-19 ENCOUNTER — Ambulatory Visit: Payer: Medicaid Other | Admitting: Obstetrics and Gynecology

## 2015-12-16 IMAGING — US US MFM UA CORD DOPPLER
1 series · 14 of 14 positions shown · non-contrast
Comparison: none

[Series 1: us mfm ua cord doppler · 14 of 14 slices shown]
[im 1/14]
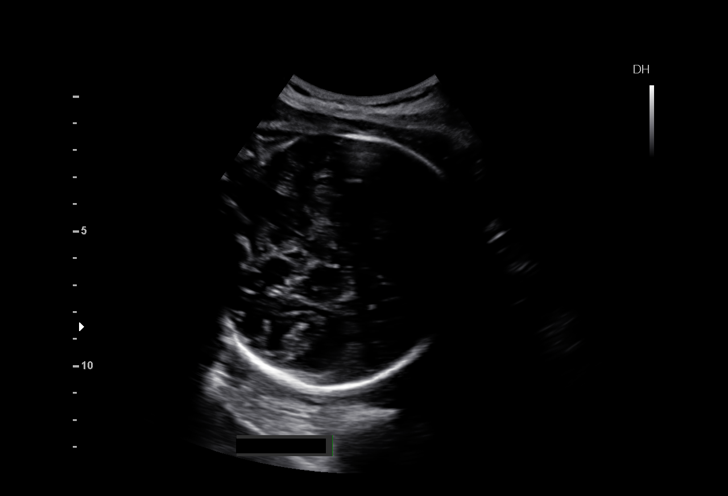
[im 2/14]
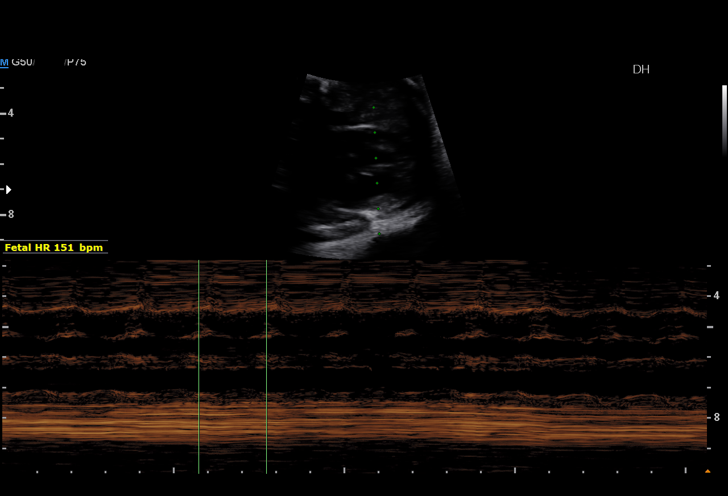
[im 3/14]
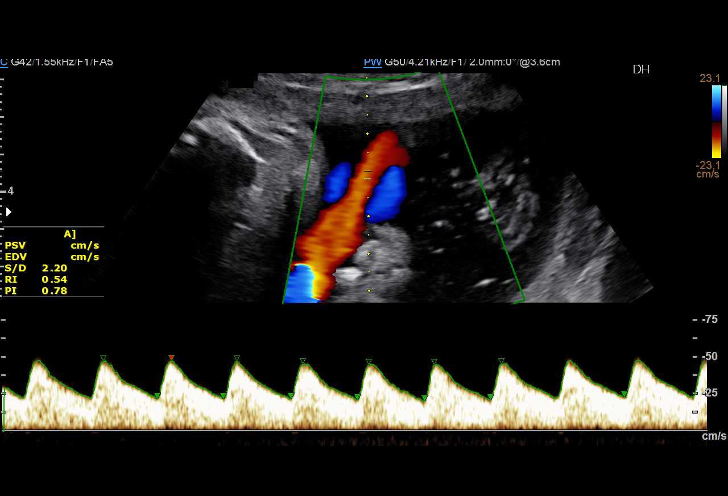
[im 4/14]
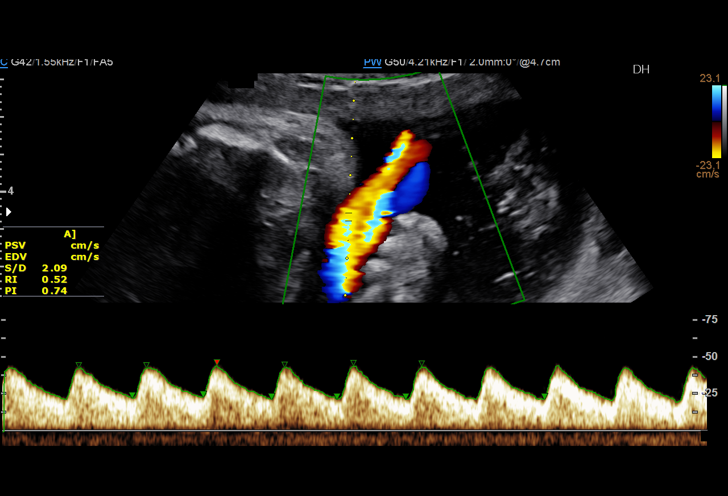
[im 5/14]
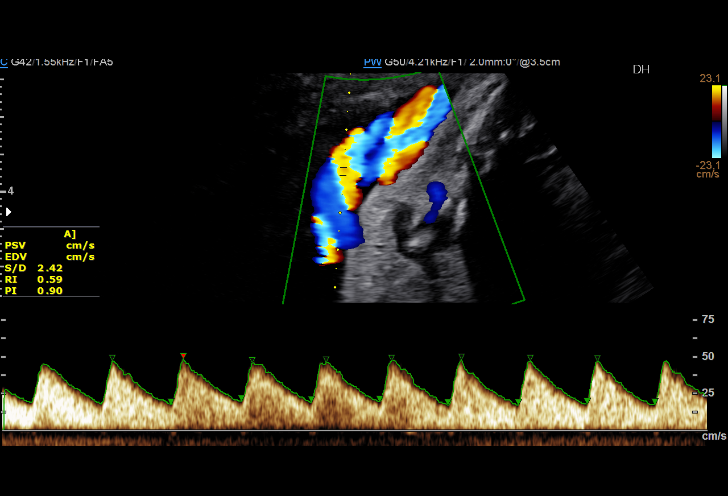
[im 6/14]
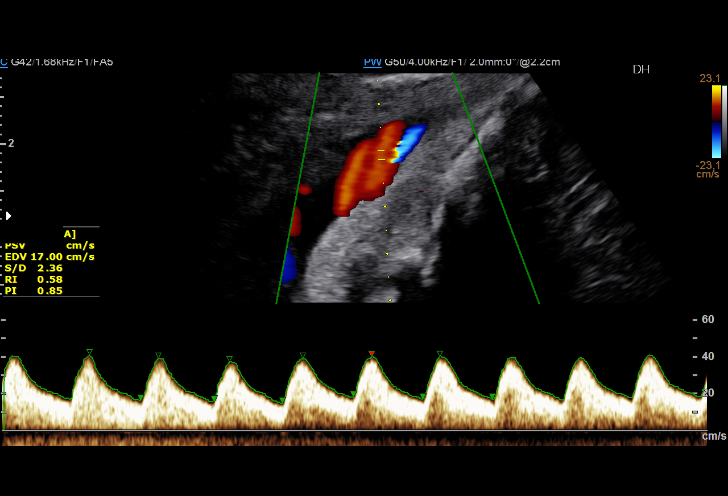
[im 7/14]
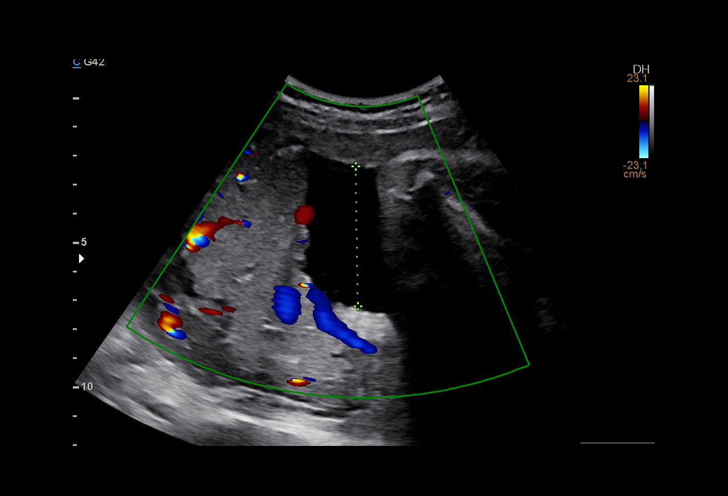
[im 8/14]
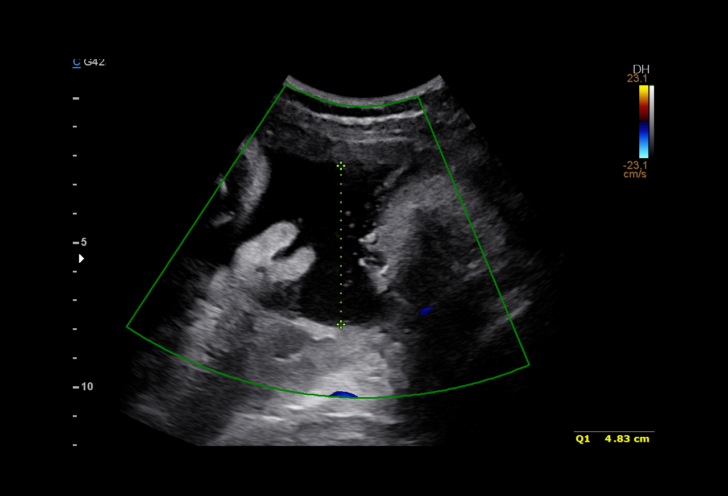
[im 9/14]
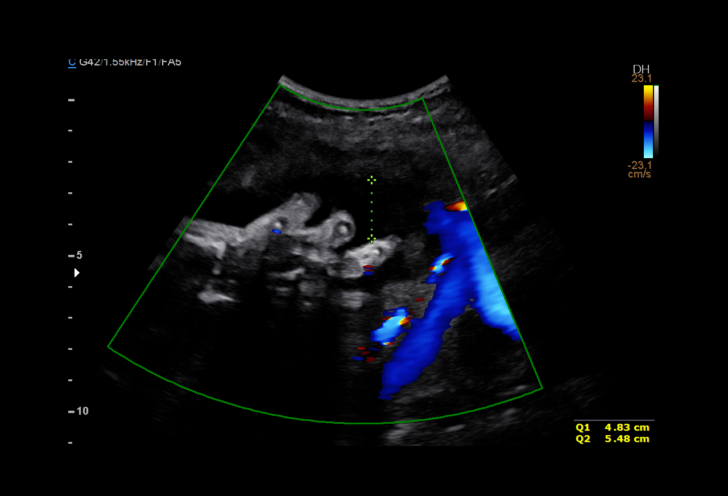
[im 10/14]
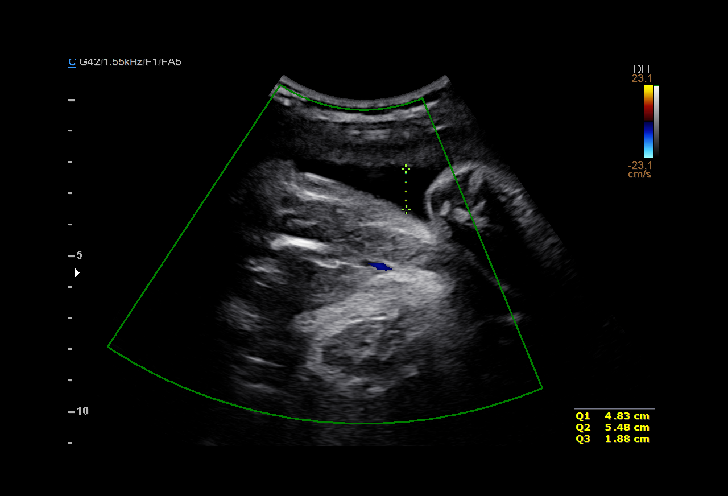
[im 11/14]
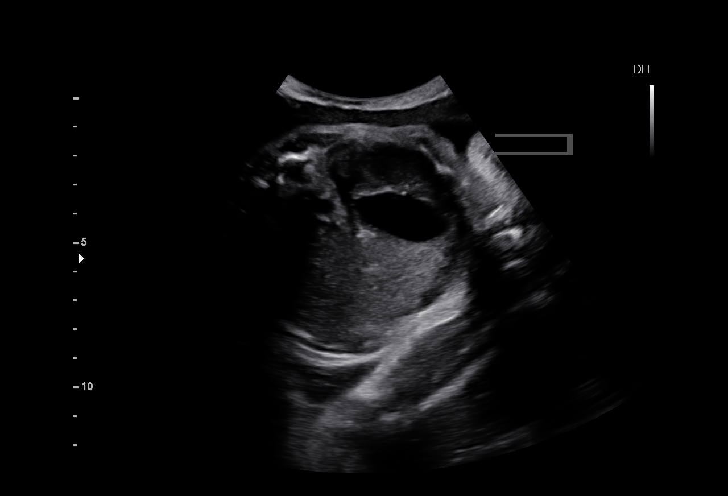
[im 12/14]
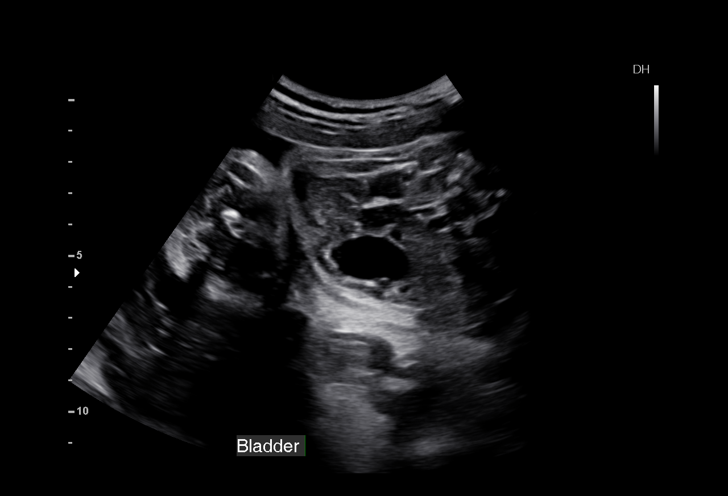
[im 13/14]
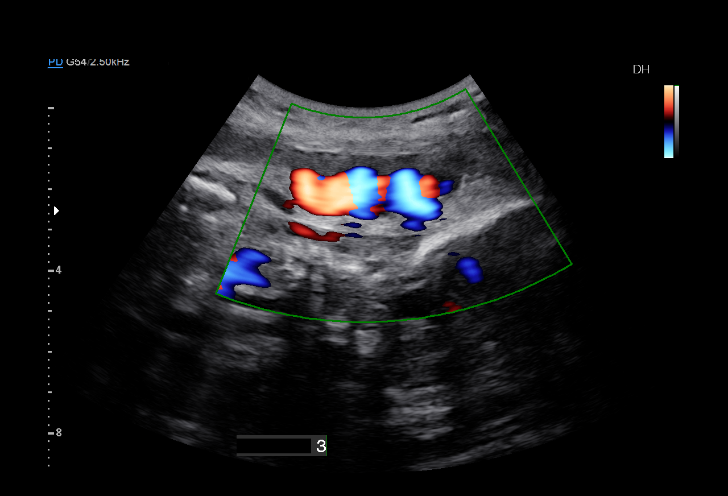
[im 14/14]
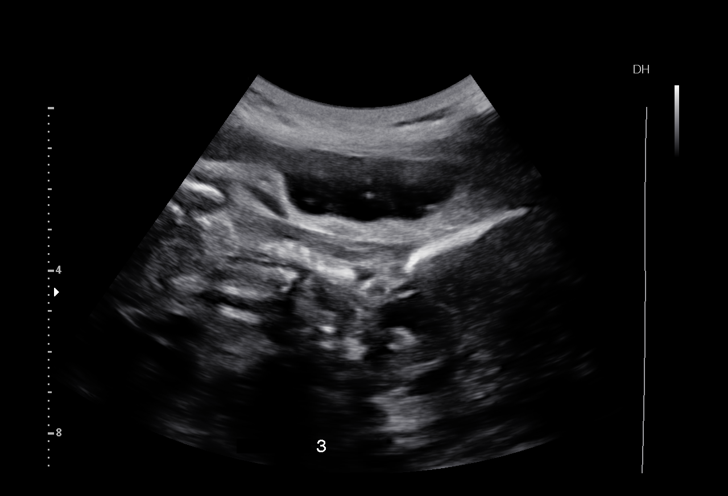

[14 of 14 positions shown; findings below may reference images not displayed]

OBSTETRICS REPORT
(Signed Final 03/10/2015 [DATE])

Name:       JACYNNET GOUNDAR                     Visit  03/10/2015 [DATE]
Date:

Service(s) Provided

US MFM UA CORD DOPPLER                                 76820.02
Indications

37 weeks gestation of pregnancy
Maternal care for known or suspected poor fetal
growth, third trimester, not applicable or
unspecified
Fetal Evaluation

Num Of             1
Fetuses:
Fetal Heart        151                          bpm
Rate:
Cardiac Activity:  Observed
Presentation:      Cephalic

Amniotic Fluid
AFI FV:      Subjectively within normal limits
AFI Sum:     13.5     cm      51  %Tile     Larg Pckt:    5.48   cm
RUQ:   4.83    cm    RLQ:   1.31    cm   LUQ:    5.48    cm   LLQ:    1.88   cm
Biophysical Evaluation

Amniotic F.V:   Pocket => 2 cm two          F. Tone:        Observed
planes
F. Movement:    Observed                    N.S.T:          Reactive
F. Breathing:   Not Observed                Score:          [DATE]
Gestational Age

LMP:           37w 6d        Date:  06/18/14                  EDD:   03/25/15
Best:          37w 6d    Det. By:   LMP  (06/18/14)           EDD:   03/25/15
Doppler - Fetal Vessels

Umbilical Artery
S/D:   2.28           49   %tile
PSV:      44.42    cm/
s
Umbilical Artery
Absent DFV:     No    Reverse         No
DFV:

Impression

Singleton intrauterine pregnancy at 37 weeks 6 days
gestation with fetal cardiac activity
Cephalic presentation
BPP [DATE] (-2 for breathing) with an AFI >13 cm
UA dopplers
Nuchal cord x3 (will recheck next week)

---------------------------------------------------------------------- Recommendations

Continue antenatal testing as scheduled (weekly BPPs
with UA Doppler studies)
Delivery at 38-39 weeks in the absence of other
complications.
